# Patient Record
Sex: Female | Born: 1970 | Race: White | Hispanic: No | Marital: Single | Smoking: Current some day smoker
Health system: Southern US, Community
[De-identification: ages and names within clinical notes are randomized; demographics above are authoritative.]

## PROBLEM LIST (undated history)

## (undated) DIAGNOSIS — E785 Hyperlipidemia, unspecified: Secondary | ICD-10-CM

## (undated) DIAGNOSIS — F32A Depression, unspecified: Secondary | ICD-10-CM

## (undated) DIAGNOSIS — F329 Major depressive disorder, single episode, unspecified: Secondary | ICD-10-CM

## (undated) DIAGNOSIS — I10 Essential (primary) hypertension: Secondary | ICD-10-CM

## (undated) HISTORY — DX: Depression, unspecified: F32.A

## (undated) HISTORY — PX: EYE SURGERY: SHX253

## (undated) HISTORY — DX: Essential (primary) hypertension: I10

## (undated) HISTORY — PX: APPENDECTOMY: SHX54

## (undated) HISTORY — DX: Hyperlipidemia, unspecified: E78.5

## (undated) HISTORY — PX: BREAST SURGERY: SHX581

## (undated) HISTORY — DX: Major depressive disorder, single episode, unspecified: F32.9

## (undated) HISTORY — PX: ABDOMINAL HYSTERECTOMY: SHX81

## (undated) HISTORY — PX: NASAL SEPTUM SURGERY: SHX37

## (undated) HISTORY — PX: THYROIDECTOMY, PARTIAL: SHX18

---

## 1999-07-19 ENCOUNTER — Inpatient Hospital Stay (HOSPITAL_COMMUNITY): Admission: AD | Admit: 1999-07-19 | Discharge: 1999-07-31 | Payer: Self-pay | Admitting: Obstetrics & Gynecology

## 1999-07-21 ENCOUNTER — Encounter: Payer: Self-pay | Admitting: Obstetrics and Gynecology

## 1999-07-24 ENCOUNTER — Encounter: Payer: Self-pay | Admitting: Obstetrics and Gynecology

## 1999-07-27 ENCOUNTER — Encounter: Payer: Self-pay | Admitting: Obstetrics & Gynecology

## 1999-07-31 ENCOUNTER — Encounter: Payer: Self-pay | Admitting: Obstetrics and Gynecology

## 1999-08-04 ENCOUNTER — Encounter: Payer: Self-pay | Admitting: Obstetrics and Gynecology

## 1999-08-04 ENCOUNTER — Inpatient Hospital Stay (HOSPITAL_COMMUNITY): Admission: AD | Admit: 1999-08-04 | Discharge: 1999-08-04 | Payer: Self-pay | Admitting: Obstetrics and Gynecology

## 1999-08-13 ENCOUNTER — Inpatient Hospital Stay (HOSPITAL_COMMUNITY): Admission: AD | Admit: 1999-08-13 | Discharge: 1999-09-03 | Payer: Self-pay | Admitting: Obstetrics and Gynecology

## 1999-08-14 ENCOUNTER — Encounter: Payer: Self-pay | Admitting: Obstetrics and Gynecology

## 1999-08-17 ENCOUNTER — Encounter: Payer: Self-pay | Admitting: Obstetrics and Gynecology

## 1999-08-20 ENCOUNTER — Encounter: Payer: Self-pay | Admitting: Obstetrics and Gynecology

## 1999-08-23 ENCOUNTER — Encounter: Payer: Self-pay | Admitting: Obstetrics and Gynecology

## 1999-08-30 ENCOUNTER — Encounter: Payer: Self-pay | Admitting: Obstetrics and Gynecology

## 1999-08-30 ENCOUNTER — Encounter: Payer: Self-pay | Admitting: *Deleted

## 2001-03-12 ENCOUNTER — Other Ambulatory Visit: Admission: RE | Admit: 2001-03-12 | Discharge: 2001-03-12 | Payer: Self-pay | Admitting: Family Medicine

## 2001-11-30 ENCOUNTER — Ambulatory Visit (HOSPITAL_COMMUNITY): Admission: RE | Admit: 2001-11-30 | Discharge: 2001-11-30 | Payer: Self-pay | Admitting: Family Medicine

## 2001-11-30 ENCOUNTER — Encounter: Payer: Self-pay | Admitting: Family Medicine

## 2001-12-17 ENCOUNTER — Other Ambulatory Visit: Admission: RE | Admit: 2001-12-17 | Discharge: 2001-12-17 | Payer: Self-pay | Admitting: Obstetrics and Gynecology

## 2002-05-30 ENCOUNTER — Encounter: Payer: Self-pay | Admitting: *Deleted

## 2002-05-30 ENCOUNTER — Emergency Department (HOSPITAL_COMMUNITY): Admission: EM | Admit: 2002-05-30 | Discharge: 2002-05-30 | Payer: Self-pay | Admitting: *Deleted

## 2003-01-07 ENCOUNTER — Other Ambulatory Visit: Admission: RE | Admit: 2003-01-07 | Discharge: 2003-01-07 | Payer: Self-pay | Admitting: Obstetrics and Gynecology

## 2004-01-27 ENCOUNTER — Other Ambulatory Visit: Admission: RE | Admit: 2004-01-27 | Discharge: 2004-01-27 | Payer: Self-pay | Admitting: Obstetrics and Gynecology

## 2004-01-29 ENCOUNTER — Emergency Department (HOSPITAL_COMMUNITY): Admission: EM | Admit: 2004-01-29 | Discharge: 2004-01-29 | Payer: Self-pay | Admitting: Emergency Medicine

## 2004-07-25 ENCOUNTER — Encounter (INDEPENDENT_AMBULATORY_CARE_PROVIDER_SITE_OTHER): Payer: Self-pay | Admitting: *Deleted

## 2004-07-25 ENCOUNTER — Observation Stay (HOSPITAL_COMMUNITY): Admission: RE | Admit: 2004-07-25 | Discharge: 2004-07-26 | Payer: Self-pay | Admitting: Obstetrics and Gynecology

## 2004-08-11 ENCOUNTER — Encounter: Payer: Self-pay | Admitting: Obstetrics and Gynecology

## 2004-08-11 ENCOUNTER — Inpatient Hospital Stay (HOSPITAL_COMMUNITY): Admission: AD | Admit: 2004-08-11 | Discharge: 2004-08-15 | Payer: Self-pay | Admitting: Obstetrics and Gynecology

## 2004-08-22 ENCOUNTER — Ambulatory Visit (HOSPITAL_COMMUNITY): Admission: RE | Admit: 2004-08-22 | Discharge: 2004-08-22 | Payer: Self-pay | Admitting: Obstetrics and Gynecology

## 2004-08-28 ENCOUNTER — Inpatient Hospital Stay (HOSPITAL_COMMUNITY): Admission: AD | Admit: 2004-08-28 | Discharge: 2004-08-30 | Payer: Self-pay | Admitting: Obstetrics and Gynecology

## 2004-11-21 ENCOUNTER — Ambulatory Visit (HOSPITAL_COMMUNITY): Admission: RE | Admit: 2004-11-21 | Discharge: 2004-11-21 | Payer: Self-pay | Admitting: Obstetrics and Gynecology

## 2006-06-03 ENCOUNTER — Other Ambulatory Visit: Admission: RE | Admit: 2006-06-03 | Discharge: 2006-06-03 | Payer: Self-pay | Admitting: Family Medicine

## 2007-02-03 ENCOUNTER — Encounter: Admission: RE | Admit: 2007-02-03 | Discharge: 2007-02-19 | Payer: Self-pay | Admitting: Family Medicine

## 2008-09-05 ENCOUNTER — Encounter (HOSPITAL_COMMUNITY): Admission: RE | Admit: 2008-09-05 | Discharge: 2008-09-20 | Payer: Self-pay | Admitting: Endocrinology

## 2008-09-30 ENCOUNTER — Ambulatory Visit (HOSPITAL_COMMUNITY): Admission: RE | Admit: 2008-09-30 | Discharge: 2008-09-30 | Payer: Self-pay | Admitting: Endocrinology

## 2008-10-04 ENCOUNTER — Other Ambulatory Visit: Admission: RE | Admit: 2008-10-04 | Discharge: 2008-10-04 | Payer: Self-pay | Admitting: Interventional Radiology

## 2008-10-04 ENCOUNTER — Encounter (INDEPENDENT_AMBULATORY_CARE_PROVIDER_SITE_OTHER): Payer: Self-pay | Admitting: Interventional Radiology

## 2008-10-04 ENCOUNTER — Encounter: Admission: RE | Admit: 2008-10-04 | Discharge: 2008-10-04 | Payer: Self-pay | Admitting: Endocrinology

## 2008-11-17 ENCOUNTER — Ambulatory Visit (HOSPITAL_COMMUNITY): Admission: RE | Admit: 2008-11-17 | Discharge: 2008-11-18 | Payer: Self-pay | Admitting: Otolaryngology

## 2008-11-17 ENCOUNTER — Encounter (INDEPENDENT_AMBULATORY_CARE_PROVIDER_SITE_OTHER): Payer: Self-pay | Admitting: Otolaryngology

## 2009-11-13 ENCOUNTER — Encounter
Admission: RE | Admit: 2009-11-13 | Discharge: 2010-02-11 | Payer: Self-pay | Admitting: Physical Medicine and Rehabilitation

## 2010-10-14 ENCOUNTER — Encounter: Payer: Self-pay | Admitting: Endocrinology

## 2011-01-08 LAB — CBC
HCT: 40 % (ref 36.0–46.0)
Hemoglobin: 14.1 g/dL (ref 12.0–15.0)
MCHC: 35.3 g/dL (ref 30.0–36.0)
MCV: 89 fL (ref 78.0–100.0)
Platelets: 253 10*3/uL (ref 150–400)
RBC: 4.49 MIL/uL (ref 3.87–5.11)
RDW: 12.4 % (ref 11.5–15.5)
WBC: 9.3 10*3/uL (ref 4.0–10.5)

## 2011-01-08 LAB — BASIC METABOLIC PANEL
BUN: 7 mg/dL (ref 6–23)
CO2: 30 mEq/L (ref 19–32)
Calcium: 9.5 mg/dL (ref 8.4–10.5)
Chloride: 104 mEq/L (ref 96–112)
Creatinine, Ser: 0.63 mg/dL (ref 0.4–1.2)
GFR calc Af Amer: >60 mL/min (ref >60)
GFR calc non Af Amer: >60 mL/min (ref >60)
Glucose, Bld: 93 mg/dL (ref 70–99)
Potassium: 4 mEq/L (ref 3.5–5.1)
Sodium: 139 mEq/L (ref 135–145)

## 2011-02-05 NOTE — Op Note (Signed)
NAMENAVPREET, SZCZYGIEL           ACCOUNT NO.:  [id number]   MEDICAL RECORD NO.:  [id number]          PATIENT TYPE:  AMB   LOCATION:  SDS                          FACILITY:  [facility]   PHYSICIAN:  Suzanna Obey, M.D.       DATE OF BIRTH:  [birth year]   DATE OF PROCEDURE:  11/17/2008  DATE OF DISCHARGE:                               OPERATIVE REPORT   PREOPERATIVE DIAGNOSES:  Left thyroid mass.   POSTOPERATIVE DIAGNOSIS:  Left thyroid mass.   SURGICAL PROCEDURE:  Left thyroidectomy.   ANESTHESIA:  General.   ESTIMATED BLOOD LOSS:  Approximately 20 mL.   INDICATIONS:  This is a 40 year old with a mass in the left thyroid that  was diagnosed with ultrasound and then a needle biopsy performed, which  showed follicular cells.  She was informed of the options of approach to  this lesion.  The surgery was discussed.  The risks and benefits were  discussed.  All her questions were answered and consent was obtained.   OPERATION:  The patient was taken to the operating room and placed in  supine position.  After adequate general endotracheal tube anesthesia  with the NIMs monitor endotracheal tube.  Incision was made at the base  of the neck.  Dissection was carried superiorly and inferiorly with  subplatysmal flap.  The self-retaining retractor was placed.  The  midline was identified.  The midline of the strap muscles was dissected  across to the left thyroid lobe where the careful dissection using the  capsule of the thyroid was dissected with hemostat dissection going  laterally, once lateral, the nerve was identified it was followed up in  to the tracheolaryngeal groove and this preserved.  The parathyroid  gland was right superior to this and it was preserved as well.  The  inferior gland was not identified.  The gland was then removed off the  Berry ligament, and the isthmus of the thyroid was divided.  There was  good hemostasis and the wound was irrigated copiously with saline.  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  The wound was then  closed with interrupted 4-0 chromic and a #7 drain was placed secured  with a 3-0 nylon.  The subcutaneous tissue was closed with interrupted 4-  0 chromic and Dermabond to close the skin.  The patient was awakened and  brought to the recovery room in stable condition and counts correct.           ______________________________  Suzanna Obey, M.D.     JB/MEDQ  D:  11/17/2008  T:  11/18/2008  Job:  [id number]   cc:   Dorisann Frames, M.D.

## 2011-02-08 NOTE — H&P (Signed)
NAMESHARMA, Lindsay           ACCOUNT NO.:  [id number]   MEDICAL RECORD NO.:  [id number]          PATIENT TYPE:  AMB   LOCATION:  SDC                           [facility]   PHYSICIAN:  Duke Salvia. Marcelle Overlie, M.D.DATE OF BIRTH:  Dec 21, 1970   DATE OF ADMISSION:  11/21/2004  DATE OF DISCHARGE:                                HISTORY & PHYSICAL   CHIEF COMPLAINT:  Chronic pelvic pain.   HISTORY OF PRESENT ILLNESS:  A 40 year old G2, P2, who underwent  uncomplicated TVH July 25, 2004, for abnormal bleeding.  The ovaries were  normal at the time of surgery.  She was discharged the following day but was  readmitted two weeks later with pelvic abscess that ultimately required CT  drainage.  She had a protracted course that included an additional admission  for IV antibiotics with resolution of the pain and the abscess over time.   Due to continued problems with pelvic pain that has not improved, she  presents now for laparoscopy to evaluate for residual adhesions and the  status of ovarian adhesions in particular.  This procedure, including risks  of bleeding, infection, adjacent organ injury, and the possible need for  open or additional surgery, were all reviewed with her, which she  understands and accepts.  She would prefer to have a diagnostic procedure  only and planned laparotomy with BSO if necessary based on the severity of  the findings at laparoscopy.   PAST MEDICAL HISTORY:  Allergies:  MYCIN drugs.   Current medications are Advil p.r.n. pain.  In the past she has been on  Paxil.   Other surgery:  Breast augmentation, TVH.   Obstetrical history:  Two vaginal deliveries at term without complications.   REVIEW OF SYSTEMS:  Significant for history of depression and anxiety, still  smoking one PPD.  Also a history of borderline hypertension in the past.   FAMILY HISTORY:  Significant for a history of varicose veins and a history  of hypertension in her father.   PHYSICAL EXAMINATION:  VITAL SIGNS:  Temperature 98.2, blood pressure  120/62.  HEENT:  Unremarkable.  NECK:  Supple without mass.  CHEST:  Lungs clear.  CARDIOVASCULAR:  Regular rate and rhythm without murmurs, rubs or gallops  noted.  BREASTS:  Implants, no masses noted.  ABDOMEN:  Soft, flat and nontender.  PELVIC:  Normal external genitalia.  Vaginal cuff is clear.  Bimanual  revealed no masses but slightly tender.  EXTREMITIES:  Unremarkable.  NEUROLOGIC:  Unremarkable.   IMPRESSION:  Chronic pelvic pain, status post total vaginal hysterectomy,  complicated by postoperative pelvic abscess formation as noted above.      RMH/MEDQ  D:  11/19/2004  T:  11/19/2004  Job:  [id number]

## 2011-02-08 NOTE — Discharge Summary (Signed)
Lindsay Dickerson, Lindsay Dickerson           ACCOUNT NO.:  [id number]   MEDICAL RECORD NO.:  [id number]          PATIENT TYPE:  INP   LOCATION:  9320                          [facility]ACILITY:  [facility]   PHYSICIAN:  Duke Salvia. Marcelle Overlie, M.D.DATE OF BIRTH:  30-Dec-1970   DATE OF ADMISSION:  08/10/2004  DATE OF DISCHARGE:                                 DISCHARGE SUMMARY   DISCHARGE DIAGNOSES:  1.  Postoperative pelvic hematoma.  2.  Postoperative pelvic abscess/infection.  3.  CT-directed drainage of pelvic abscess.   SUMMARY OF THE HISTORY AND PHYSICAL EXAMINATION:  Please see admission H&P  for details.  Briefly, a 40 year old who underwent a TVH July 25, 2004.  She developed some diarrhea postoperatively and was treated with Flagyl.  Her C. difficile did return positive at that time. The day of admission her  diarrhea had been absent for some 3 days but she presented with mid and low  abdominal cramping pain with some nausea and vomiting.  Her temperature was  99.1, WBC on admission was elevated, and she had increased fullness on exam.  She was admitted for further evaluation including IV antibiotics.   HOSPITAL COURSE:  After admission, the patient was started on IV  antibiotics.  A CT was done that showed changes consistent with a pelvic  abscess/hematoma.  General surgery was consulted because there was some  concern initially that CT drainage would not be possible, but on further  review it was felt that this would be the best approach.  She was on IV  Unasyn at that point.  On August 11, 2004 she underwent CT-guided abscess  drainage through left gluteal approach that drained a large amount of old,  bloody fluid.  She remained afebrile throughout her hospital course, on a  regular diet.  Her WBC was still elevated in the 16-20,000 range.  By  November 22, hemoglobin 10.3, WBC was 13.9.  The follow-up CT was done at  that point that showed decreased drainage in the central abscess area and  clinically she was much improved at that point.  On the day of discharge -  November 23 - she was tolerating a regular diet, was afebrile, her WBC was  11.6 with a hemoglobin of 10.7.  She will be discharged on antibiotics with  the CT drain in place.  Follow-up CT will be done in 2-3 days and decision  made regarding removing the catheter at that point.   DISPOSITION:  The patient is discharged on:  1.  Augmentin 875 p.o. b.i.d. x7.  2.  Flagyl 500 p.o. q.i.d. x7.  3.  Tylox p.r.n. pain.   She will have follow-up CT in 2-3 days with interventional radiology and  they were notified to contact me regarding findings to decide regarding  removal of the catheter at that point.  I will see her back in my office in  4 days.  Advised to report any fever over 101, increased pain, vaginal  bleeding, persistent nausea or vomiting, or urinary complaints.  She had  already been reviewed specific instructions regarding diet, sex, exercise.   CONDITION:  Good.  ACTIVITY:  Graded increase.     Rich   RMH/MEDQ  D:  08/15/2004  T:  08/15/2004  Job:  [id number]   cc:   Lorne Skeens. Hoxworth, M.D.  [address]  [place]  [place] [address]  Fax: [phone]

## 2011-02-08 NOTE — H&P (Signed)
Lindsay Dickerson, Lindsay Dickerson           ACCOUNT NO.:  [id number]   MEDICAL RECORD NO.:  [id number]          PATIENT TYPE:  INP   LOCATION:  9315                          [facility]ILITY[facility]   PHYSICIAN:  Duke Salvia. Marcelle Overlie, M.D.DATE OF BIRTH:  August 16, 1971   DATE OF ADMISSION:  08/28/2004  DATE OF DISCHARGE:                                HISTORY & PHYSICAL   August 27, 2004   CHIEF COMPLAINT:  Post vaginal hysterectomy, pelvic impaction.   HISTORY OF PRESENT ILLNESS:  A 40 year old, G2, P2 who underwent TVH  November 2 which was uncomplicated except for postoperative anemia.  She  presented back to our office November 14 complaining of some abdominal  cramping and diarrhea which was positive for C difficile at that point and  was started on metronidazole.  When she was then two weeks postop on  November 18 and was evaluated by Dr. Henderson Cloud, her diarrhea had improved,  temperature was 98.2 but she had a diffuse fullness at the cuff with an  ultrasound that showed a 9.5 cm wide pelvic mass consistent with a  hematoma/abscess.  She was admitted to [facility] at that point,  ultimately had CT directed drainage and was sent home on oral Augmentin and  Flagyl with followup CT early December that showed still some drainage per  the cuff with two other areas that were not connected so the drain was  removed at that point.   She was seen yesterday stating that she had felt better over the weekend  after the drain was removed, began to experience some increased pelvic  discomfort. She still had a fullness at the cuff on exam although she was  afebrile in the office yesterday. CBC was obtained showing a white count of  19,000. She call this morning saying she had a temperature elevation of 102  and is admitted now for further evaluation, followup CT scanning, IV  antibiotics and possible redrainage of secondary abscess.   PAST MEDICAL HISTORY:  Allergies:  MYCIN DRUGS.  Operations:  TVH.  The  only  other surgery has been breast augmentation. History is also significant for  depression and anxiety. History of borderline hypertension in the past.   CURRENT MEDICATIONS:  Paxil, Wellbutrin and pain medications.   OBSTETRICAL HISTORY:  Two vaginal deliveries at term without complications.   SOCIAL HISTORY:  She does smoke one PPD.   PHYSICAL EXAMINATION:  VITAL SIGNS:  Temperature 100.6, blood pressure  120/72, pulse 88.  HEENT:  Unremarkable.  NECK:  Supple without masses.  LUNGS:  Clear.  CARDIOVASCULAR:  Regular rate and rhythm without murmurs, rubs or gallops.  BREASTS:  Deferred.  ABDOMEN:  Soft, flat and nontender, no rebound.  Vulva, vaginal cuff normal.  Minimal drainage. The cuff looks like it is healing well.  There is a  fullness on pelvic exam midline slightly posterior.  EXTREMITIES:  Unremarkable.  NEUROLOGIC:  Unremarkable.   IMPRESSION:  Post TVH pelvic hematoma/abscess, possible recurrence.   PLAN:  Will admit for CT this afternoon, possible CT directed drainage  depending on the drainage. Restart IV antibiotics and follow.  Rich   RMH/MEDQ  D:  08/28/2004  T:  08/28/2004  Job:  [id number]

## 2011-02-08 NOTE — Consult Note (Signed)
Lindsay Dickerson, Lindsay Dickerson           ACCOUNT NO.:  [id number]   MEDICAL RECORD NO.:  [id number]          PATIENT TYPE:  OUT   LOCATION:  CATS                         FACILITY:  [facility]   PHYSICIAN:  Sharlet Salina T. Hoxworth, M.D.DATE OF BIRTH:  Jan 26, 1971   DATE OF CONSULTATION:  08/11/2004  DATE OF DISCHARGE:                                   CONSULTATION   CHIEF COMPLAINT:  Abdominal pain following vaginal hysterectomy.   HISTORY OF PRESENT ILLNESS:  I was asked by Dr. Guy Sandifer. Tomblin to evaluate  Lindsay Dickerson today.  She is a 40 year old female who is status post vaginal  hysterectomy on July 25, 2004 for excessive uterine bleeding.  Her  immediate postoperative course was apparently uncomplicated, but at home the  patient complained of persistent abdominal pain.  She describes crampy pain  that initially was somewhat diffuse throughout her abdomen but over the past  week or so has become more localized in the lower abdomen and more intense.  She was seen as an outpatient about 5 days ago and was started on oral  antibiotics at that time.  Her pain, however, intensified over the last  several days and she began having intermittent nausea and vomiting  associated with the pain.  An ultrasound done in her gynecologist's office  revealed a fluid collection in the pelvis and she was admitted for further  evaluation and CT scan obtained.  She has had initially constipation  following the procedure then after taking a laxative had 5 or 6 days of  diarrhea last week, then was given Imodium.  She had no bowel movements for  2 days but has had a normal bowel movement yesterday and today.  She has had  episodes of nausea and vomiting, but this has been related mostly to the  pain, and she is currently not nauseated.  No abdominal distention.  She has  not noted any fever or chills at home.   PAST MEDICAL/SURGICAL HISTORY:  1.  Status post appendectomy.  2.  Breast augmentation.  3.  Sinus  surgery.  4.  Medically she has been treated for depression.  5.  Mild hypertension.   MEDICATIONS:  Paxil and Prinivil 10 daily.   ALLERGIES:  She is allergic to all MYCINS.   REVIEW OF SYSTEMS:  GENERAL: No fever, chills, weight change.  RESPIRATORY:  No shortness of breath, cough, wheezing.  CARDIAC: No chest pain,  palpitations.  ABDOMEN and GI: As above.  Urinary: No burning or frequency.   PHYSICAL EXAMINATION:  VITAL SIGNS: Temperature is 99.9, pulse 116 and  regular, respirations 16, blood pressure 114/75.  GENERAL: Alert, white female who appears uncomfortable.  SKIN: Warm, dry.  No rash or infection.  HEENT: No masses or thyromegaly.  Sclerae are nonicteric.  LUNGS: Clear to auscultation without increased work of breathing.  CARDIAC: Regular rate and rhythm without murmur.  ABDOMEN: Nondistended.  Normoactive bowel sounds.  There is a moderate  diffuse lower abdominal tenderness with some guarding.  No palpable masses.  There is a percutaneous drain present in the left gluteal area draining what  appears to be  old bloody fluid.  EXTREMITIES: No joint swelling or deformity.  NEUROLOGIC: Alert and oriented.  Motor intensity grossly normal.   LABORATORY:  White count today 16.2 down from 18,000 on admission,  hemoglobin 11.4.  Electrolytes abnormal for sodium of 131, albumin is 2.5.   I reviewed her CT of the abdomen and pelvis.  This shows a large fluid  collection in the central pelvis consistent with abscess.  There are several  adjacent smaller fluid collections some of which appear to communicate with  the larger collection.   ASSESSMENT AND PLAN:  Postoperative pelvic hematoma, possible infected early  abscess.  I recommended percutaneous drainage which has already been  accomplished by the time I am seeing the patient and it appears to have been  successful.  The patient is already on broad-spectrum antibiotics on Unasyn  appropriately.  We will await cultures from  the fluid and follow closely  clinically.  We will plan to repeat a CT scan in 3 to 5 days depending on  the patient's clinical course.     Benj   BTH/MEDQ  D:  08/11/2004  T:  08/11/2004  Job:  [id number]

## 2011-02-08 NOTE — H&P (Signed)
Lindsay Dickerson, SHIFLET           ACCOUNT NO.:  [id number]   MEDICAL RECORD NO.:  [id number]          PATIENT TYPE:  INP   LOCATION:  NA                            [facility]   PHYSICIAN:  Duke Salvia. Marcelle Overlie, M.D.DATE OF BIRTH:  Jun 01, 1971   DATE OF ADMISSION:  DATE OF DISCHARGE:                                HISTORY & PHYSICAL   CHIEF COMPLAINT:  Abnormal uterine bleeding unresponsive to conservative  measures.   HISTORY OF PRESENT ILLNESS:  A 40 year old G2, P2 with persistent problems  with menorrhagia that have not improved with hormonal management.  SHG April  of 2004 was normal.  She presents at this time for definitive TVH.  This  procedure including the risk of bleeding, infection, adjacent organ injury,  the possible need for open or additional surgery discussed along with her  expected recovery time.   PAST MEDICAL HISTORY:   ALLERGIES:  Mycin drugs.   CURRENT MEDICATIONS:  Paxil and Wellbutrin.  OCP's, (Microgestin).   SURGERY:  Breast augmentation.   OBSTETRICAL HISTORY:  She has had two vaginal deliveries without  complication.   REVIEW OF SYSTEMS:  Significant for a history of depression and anxiety,  history of borderline hypertension in the past, still is smoking.  One PPD.   PHYSICAL EXAMINATION:  VITAL SIGNS:  Temperature 98.2, blood pressure  120/75.  HEENT:  Unremarkable.  NECK:  Supple.  No masses.  LUNGS:  Clear.  CARDIOVASCULAR:  A regular rate and rhythm without murmurs, rubs or gallops.  BREASTS:  She has breast implants.  No masses noted.  ABDOMEN:  Soft, flat, nontender.  PELVIC:  Normal external genitalia.  Vagina stripe is clear.  Uterus is mid  position, mobile.  Adnexa negative.   IMPRESSION:  Persistent menorrhagia, uncontrolled with conservative  measures.   PLAN:  TBH.  The procedure and the risks were reviewed as above.     Rich   RMH/MEDQ  D:  07/24/2004  T:  07/24/2004  Job:  [id number]

## 2011-02-08 NOTE — Op Note (Signed)
Lindsay Dickerson, Lindsay Dickerson           ACCOUNT NO.:  [id number]   MEDICAL RECORD NO.:  [id number]           PATIENT TYPE:   LOCATION:                                 FACILITY:   PHYSICIAN:  Duke Salvia. Marcelle Overlie, M.D.    DATE OF BIRTH:   DATE OF PROCEDURE:  07/25/2004  DATE OF DISCHARGE:                                 OPERATIVE REPORT   PREOPERATIVE DIAGNOSIS:  Menorrhagia.   POSTOPERATIVE DIAGNOSIS:  Menorrhagia.   PROCEDURE:  TVH.   SURGEON:  Duke Salvia. Marcelle Overlie, M.D.   ANESTHESIA:  General endotracheal.   COMPLICATIONS:  None.   DRAINS:  Foley catheter.   BLOOD LOSS:  Less than 100 mL.   PROCEDURE AND FINDINGS:  The patient is taken to the operating room.  After  an adequate level of general endotracheal anesthesia was obtained with the  patient's legs in stirrups, perineum and vagina were prepped and draped with  Betadine.  The bladder was drained.  __EUA________ carried out.  The uterus  was upper limit of normal of normal size, mobile, posterior.  Adnexa are  negative.  A weighted speculum was positioned.  The cervix was grasped with  tenaculum and cervicovaginal mucosa was incised.  Posterior culdotomy  performed without difficulty.  The left uterosacral ligament was then  clamped, divided, and suture ligated with 0 Dexon in Haney fashion and held  temporarily.  The exact same repeated on the opposite side.  The anterior  mucosa was advanced superiorly until the peritoneal reflection could be  identified.  This was entered sharply and then retractor was then used to  gently elevated the bladder out of the field.  The LigaSure system was then  used to clamp, coagulate, and cut the uterine vasculature pedicles and upper  broad ligament pedicles.  After this was completed and was noted to be  hemostatic, the fundus of the uterus was then delivered posteriorly.  The  remaining utero-ovarian pedicle was clamped with a curved Haney clamp and  the specimen excised.  These  pedicles were first free tied with 0 Dexon  suture followed by a suture ligature of Dexon and held temporarily.  The  cuff was then closed from 3 to 9 o'clock with a 2-0 running locked suture.  All major pedicles were inspected and noted to be normal at that time.  Tubes and ovaries were normal and were thus conserved.  Prior to closure,  sponge, needle, instrument counts reported as correct x2.  McCall's  culdoplasty suture was then placed from left uterosacral ligament taking up  posterior  peritoneum across to the right uterosacral ligament and tied down for extra  posterior support.  The cuff was closed from right to left with interrupted  2-0 Monocryl sutures with excellent hemostasis.  A Foley catheter was  positioned draining clear urine.  She tolerated this well, went to recovery  room in good condition.     Rich   RMH/MEDQ  D:  07/25/2004  T:  07/25/2004  Job:  [id number]

## 2011-02-08 NOTE — Op Note (Signed)
Lindsay Dickerson, Lindsay Dickerson           ACCOUNT NO.:  [id number]   MEDICAL RECORD NO.:  [id number]          PATIENT TYPE:  AMB   LOCATION:  SDC                           [facility]   PHYSICIAN:  Duke Salvia. Marcelle Overlie, M.D.DATE OF BIRTH:  1970-10-07   DATE OF PROCEDURE:  11/21/2004  DATE OF DISCHARGE:                                 OPERATIVE REPORT   PREOPERATIVE DIAGNOSES:  Chronic pelvic pain.   POSTOPERATIVE DIAGNOSES:  Chronic pelvic pain.   PROCEDURE:  Diagnostic laparoscopy with lysis of extensive adhesions, right  ovarian cystotomy.   SURGEON:  Duke Salvia. Marcelle Overlie, M.D.   ANESTHESIA:  General endotracheal.   COMPLICATIONS:  None.   DRAINS:  Foley catheter.   ESTIMATED BLOOD LOSS:  Less than 5 mL.   DESCRIPTION OF PROCEDURE:  The patient was taken to the operating room after  an adequate level of general endotracheal anesthesia was obtained. With the  patient's legs in stirrups, the abdomen, perineum and vagina were prepped  and draped with Betadine, the bladder was drained, EUA carried out and no  masses were noted. This patient has had a previous TVH 3-4 months ago.  Attention direct to the subumbilical area where 0.5% plain was infiltrated,  a small incision was made, the Veress needle was introduced without  difficulty, its intraabdominal position was verified by pressure and water  testing. After 2.5 liter pneumoperitoneum was then created, laparoscopic  trocar and sleeve were then introduced without difficulty. Three  fingerbreadths above the symphysis in the midline, a 5 mm trocar was  inserted under direct visualization.  The patient then placed in  Trendelenburg and the pelvic findings as followings.   There were some filmy adhesions of the sigmoid colon into the area of the  vaginal cuff. These were lysed in a vascular plane allowing the colon to be  gently pushed out of the way with the patient in Trendelenburg. Once this  was accomplished, better  visualization was obtained. There was some minimal  omental adhesions into the left anterior abdominal wall which were lysed in  a vascular plane.  Numerous filmy adhesions around the vaginal cuff area and  surrounding both ovaries were then lysed easily. Once this was accomplished,  the ovaries could be elevated and the left ovary was noted to be small and  50% adherent to the left pelvic sidewall but appeared to be normal. The  right ovary was able to be manipulated easily but was enlarged with a 4 cm  smooth wall cyst, bipolar was used to coagulate the surface and cystotomy  was performed, clear fluid was then drained. The [place]zhat was used to irrigate  the inside of the cyst and a small old clot was extruded, this was removed.  This area was hemostatic and the cyst was easily deflated.  The upper  abdomen was unremarkable. No other abnormalities were noted. The pelvis was  then  irrigated copiously with LR and aspirated. No other abnormalities or  adhesions were noted. Instruments were removed, gas allowed to escape,  defects closed with 4-0 Dexon subcuticular sutures and Dermabond. She  tolerated this well and  went to recovery room in good condition.      RMH/MEDQ  D:  11/21/2004  T:  11/21/2004  Job:  [id number]

## 2012-12-21 ENCOUNTER — Telehealth: Payer: Self-pay | Admitting: Nurse Practitioner

## 2012-12-21 NOTE — Telephone Encounter (Signed)
Called [facility] and left message to please send electronic request for cholesterol med refill.

## 2013-05-17 ENCOUNTER — Encounter: Payer: Self-pay | Admitting: Nurse Practitioner

## 2013-05-19 ENCOUNTER — Other Ambulatory Visit: Payer: Self-pay | Admitting: Nurse Practitioner

## 2013-05-20 NOTE — Telephone Encounter (Signed)
Last seen 10/29/12  MMM  Last lipid 10/29/12

## 2013-06-20 ENCOUNTER — Other Ambulatory Visit: Payer: Self-pay | Admitting: Nurse Practitioner

## 2013-07-26 ENCOUNTER — Other Ambulatory Visit: Payer: Self-pay | Admitting: Nurse Practitioner

## 2013-07-28 ENCOUNTER — Other Ambulatory Visit: Payer: Self-pay | Admitting: Nurse Practitioner

## 2013-07-28 NOTE — Telephone Encounter (Signed)
ntbs

## 2013-07-28 NOTE — Telephone Encounter (Signed)
Last seen and last lipids 10/29/12  MMM

## 2013-08-24 ENCOUNTER — Other Ambulatory Visit: Payer: Self-pay | Admitting: Nurse Practitioner

## 2013-09-24 ENCOUNTER — Other Ambulatory Visit: Payer: Self-pay | Admitting: Nurse Practitioner

## 2013-09-28 ENCOUNTER — Telehealth: Payer: Self-pay | Admitting: Nurse Practitioner

## 2013-09-28 MED ORDER — LISINOPRIL-HYDROCHLOROTHIAZIDE 20-12.5 MG PO TABS: 1.0000 | ORAL_TABLET | Freq: Every day | ORAL | Status: DC

## 2013-09-28 MED ORDER — ATORVASTATIN CALCIUM 40 MG PO TABS: 40.0000 mg | ORAL_TABLET | Freq: Every day | ORAL | Status: DC

## 2013-09-28 NOTE — Telephone Encounter (Signed)
patiet aware to pick up rx at pharmacy

## 2013-09-30 ENCOUNTER — Other Ambulatory Visit: Payer: Self-pay | Admitting: Nurse Practitioner

## 2013-09-30 DIAGNOSIS — E785 Hyperlipidemia, unspecified: Secondary | ICD-10-CM

## 2013-09-30 DIAGNOSIS — I10 Essential (primary) hypertension: Secondary | ICD-10-CM

## 2013-10-01 ENCOUNTER — Ambulatory Visit: Payer: Self-pay | Admitting: Nurse Practitioner

## 2013-10-27 ENCOUNTER — Ambulatory Visit (INDEPENDENT_AMBULATORY_CARE_PROVIDER_SITE_OTHER): Payer: Medicaid Other | Admitting: Nurse Practitioner

## 2013-10-27 ENCOUNTER — Encounter: Payer: Self-pay | Admitting: Nurse Practitioner

## 2013-10-27 VITALS — BP 115/81 | HR 89 | Temp 98.4°F | Ht 64.0 in | Wt 130.0 lb

## 2013-10-27 DIAGNOSIS — F329 Major depressive disorder, single episode, unspecified: Secondary | ICD-10-CM

## 2013-10-27 DIAGNOSIS — M25511 Pain in right shoulder: Secondary | ICD-10-CM

## 2013-10-27 DIAGNOSIS — E785 Hyperlipidemia, unspecified: Secondary | ICD-10-CM | POA: Insufficient documentation

## 2013-10-27 DIAGNOSIS — F3289 Other specified depressive episodes: Secondary | ICD-10-CM

## 2013-10-27 DIAGNOSIS — I10 Essential (primary) hypertension: Secondary | ICD-10-CM

## 2013-10-27 DIAGNOSIS — M25519 Pain in unspecified shoulder: Secondary | ICD-10-CM

## 2013-10-27 DIAGNOSIS — F32A Depression, unspecified: Secondary | ICD-10-CM

## 2013-10-27 MED ORDER — ATORVASTATIN CALCIUM 40 MG PO TABS: ORAL_TABLET | ORAL | Status: DC

## 2013-10-27 MED ORDER — ESCITALOPRAM OXALATE 20 MG PO TABS: 20.0000 mg | ORAL_TABLET | Freq: Every day | ORAL | Status: DC

## 2013-10-27 MED ORDER — LISINOPRIL-HYDROCHLOROTHIAZIDE 20-12.5 MG PO TABS: 1.0000 | ORAL_TABLET | Freq: Every day | ORAL | Status: DC

## 2013-10-27 NOTE — Progress Notes (Signed)
Subjective:    Patient ID: Lindsay Dickerson, female    DOB: Nov 03, 1970, 43 y.o.   MRN: [id number]  Hypertension This is a chronic problem. The current episode started more than 1 year ago. The problem has been resolved since onset. The problem is controlled. Pertinent negatives include no anxiety, chest pain, headaches, neck pain, palpitations, peripheral edema, shortness of breath or sweats. There are no associated agents to hypertension. Risk factors for coronary artery disease include dyslipidemia, family history and post-menopausal state. Past treatments include Lindsay inhibitors and diuretics. The current treatment provides significant improvement. Compliance problems include diet and exercise.   Hyperlipidemia This is a chronic problem. The current episode started more than 1 year ago. The problem is controlled. Recent lipid tests were reviewed and are normal. She has no history of diabetes, hypothyroidism or obesity. Factors aggravating her hyperlipidemia include thiazides. Pertinent negatives include no chest pain or shortness of breath. Current antihyperlipidemic treatment includes statins. The current treatment provides significant improvement of lipids. Compliance problems include adherence to diet and adherence to exercise.   * Patient says for the 2 months she has been unable to completely lift her right arm without pain- denies any injury that she can remember. Has been getting  Worse- now it even hurts to lay on- Had frozen shoulder on left in the past. 8 Family says that she needs antidepressant- says that she is sad all the time and impossible to live with.   Review of Systems  Respiratory: Negative for shortness of breath.   Cardiovascular: Negative for chest pain and palpitations.  Musculoskeletal: Negative for neck pain.  Neurological: Negative for headaches.       Objective:   Physical Exam  Constitutional: She is oriented to person, place, and time. She appears  well-developed and well-nourished.  HENT:  Nose: Nose normal.  Mouth/Throat: Oropharynx is clear and moist.  Eyes: EOM are normal.  Neck: Trachea normal, normal range of motion and full passive range of motion without pain. Neck supple. No JVD present. Carotid bruit is not present. No thyromegaly present.  Cardiovascular: Normal rate, regular rhythm, normal heart sounds and intact distal pulses.  Exam reveals no gallop and no friction rub.   No murmur heard. Pulmonary/Chest: Effort normal and breath sounds normal.  Abdominal: Soft. Bowel sounds are normal. She exhibits no distension and no mass. There is no tenderness.  Musculoskeletal: Normal range of motion.  Decrease ROM of right shoulder with pain on abduction and internal rotation- point tenderness along anterior aspect of shoulder- weak against resisatnce  Lymphadenopathy:    She has no cervical adenopathy.  Neurological: She is alert and oriented to person, place, and time. She has normal reflexes.  Skin: Skin is warm and dry.  Psychiatric: She has a normal mood and affect. Her behavior is normal. Judgment and thought content normal.   BP 115/81  Pulse 89  Temp(Src) 98.4 F (36.9 C) (Oral)  Ht 5' 4"  (1.626 m)  Wt 130 lb (58.968 kg)  BMI 22.30 kg/m2        Assessment & Plan:   1. Hypertension   2. Hyperlipidemia LDL goal < 100   3. Depression   4. Right shoulder pain    Orders Placed This Encounter  Procedures  . CMP14+EGFR  . NMR, lipoprofile  . Ambulatory referral to Orthopedic Surgery    Referral Priority:  Routine    Referral Type:  Surgical    Referral Reason:  Specialty Services Required  Requested Specialty:  Orthopedic Surgery    Number of Visits Requested:  1   Meds ordered this encounter  Medications  . escitalopram (LEXAPRO) 20 MG tablet    Sig: Take 1 tablet (20 mg total) by mouth daily.    Dispense:  30 tablet    Refill:  6    Order Specific Question:  Supervising Provider    Answer:  Chipper Herb [[id number]]  . lisinopril-hydrochlorothiazide (PRINZIDE,ZESTORETIC) 20-12.5 MG per tablet    Sig: Take 1 tablet by mouth daily.    Dispense:  30 tablet    Refill:  0    Needs to be seen before next refill    Order Specific Question:  Supervising Provider    Answer:  Chipper Herb [[id number]]  . atorvastatin (LIPITOR) 40 MG tablet    Sig: TAKE 1 TABLET BY MOUTH EVERY DAY    Dispense:  30 tablet    Refill:  0    Needs to be seen before next refill    Order Specific Question:  Supervising Provider    Answer:  Chipper Herb [[id number]]    Labs pending Health maintenance reviewed Diet and exercise encouraged Continue all meds Follow up  In 3 months   [place], FNP

## 2013-10-27 NOTE — Patient Instructions (Signed)

## 2013-10-29 LAB — NMR, LIPOPROFILE
Cholesterol: 108 mg/dL (ref <200)
HDL Cholesterol by NMR: 28 mg/dL — ABNORMAL LOW (ref >=40)
HDL Particle Number: 25.2 umol/L — ABNORMAL LOW (ref >=30.5)
LDL Particle Number: 842 nmol/L (ref <1000)
LDL Size: 19.6 nm — ABNORMAL LOW (ref >20.5)
LDLC SERPL CALC-MCNC: 48 mg/dL (ref <100)
LP-IR Score: 63 — ABNORMAL HIGH (ref <=45)
Small LDL Particle Number: 660 nmol/L — ABNORMAL HIGH (ref <=527)
Triglycerides by NMR: 162 mg/dL — ABNORMAL HIGH (ref <150)

## 2013-10-29 LAB — CMP14+EGFR
ALT: 14 IU/L (ref 0–32)
AST: 19 IU/L (ref 0–40)
Albumin/Globulin Ratio: 1.8 (ref 1.1–2.5)
Albumin: 5.1 g/dL (ref 3.5–5.5)
Alkaline Phosphatase: 82 IU/L (ref 39–117)
BUN/Creatinine Ratio: 16 (ref 9–23)
BUN: 10 mg/dL (ref 6–24)
CO2: 21 mmol/L (ref 18–29)
Calcium: 10.2 mg/dL (ref 8.7–10.2)
Chloride: 96 mmol/L — ABNORMAL LOW (ref 97–108)
Creatinine, Ser: 0.63 mg/dL (ref 0.57–1.00)
GFR calc Af Amer: 126 mL/min/{1.73_m2} (ref >59)
GFR calc non Af Amer: 109 mL/min/{1.73_m2} (ref >59)
Globulin, Total: 2.9 g/dL (ref 1.5–4.5)
Glucose: 115 mg/dL — ABNORMAL HIGH (ref 65–99)
Potassium: 4.2 mmol/L (ref 3.5–5.2)
Sodium: 138 mmol/L (ref 134–144)
Total Bilirubin: 0.7 mg/dL (ref 0.0–1.2)
Total Protein: 8 g/dL (ref 6.0–8.5)

## 2013-12-01 ENCOUNTER — Other Ambulatory Visit: Payer: Self-pay | Admitting: Nurse Practitioner

## 2014-03-02 ENCOUNTER — Telehealth: Payer: Self-pay | Admitting: Nurse Practitioner

## 2014-03-03 NOTE — Telephone Encounter (Signed)
Patient wants to know if you can refill her mertronizole cream

## 2014-03-04 MED ORDER — METRONIDAZOLE 0.75 % VA GEL: 1.0000 | Freq: Two times a day (BID) | VAGINAL | Status: DC

## 2014-03-04 NOTE — Telephone Encounter (Signed)
rx sent to pharmacy

## 2014-03-06 ENCOUNTER — Other Ambulatory Visit: Payer: Self-pay | Admitting: Nurse Practitioner

## 2014-03-07 ENCOUNTER — Telehealth: Payer: Self-pay | Admitting: Nurse Practitioner

## 2014-03-07 NOTE — Telephone Encounter (Signed)
Called pt regarding appt Pt was not aware of RX sent in to pharmacy until today Wants to try RX first but if sxs persist will call to schedule

## 2014-04-15 ENCOUNTER — Ambulatory Visit (INDEPENDENT_AMBULATORY_CARE_PROVIDER_SITE_OTHER): Payer: Medicaid Other | Admitting: Family Medicine

## 2014-04-15 ENCOUNTER — Telehealth: Payer: Self-pay | Admitting: Nurse Practitioner

## 2014-04-15 VITALS — BP 117/72 | HR 82 | Temp 99.2°F | Ht 64.0 in | Wt 127.8 lb

## 2014-04-15 DIAGNOSIS — J0101 Acute recurrent maxillary sinusitis: Secondary | ICD-10-CM

## 2014-04-15 DIAGNOSIS — B9689 Other specified bacterial agents as the cause of diseases classified elsewhere: Secondary | ICD-10-CM

## 2014-04-15 DIAGNOSIS — J0100 Acute maxillary sinusitis, unspecified: Secondary | ICD-10-CM

## 2014-04-15 DIAGNOSIS — A499 Bacterial infection, unspecified: Secondary | ICD-10-CM

## 2014-04-15 DIAGNOSIS — N760 Acute vaginitis: Secondary | ICD-10-CM

## 2014-04-15 MED ORDER — METRONIDAZOLE 0.75 % VA GEL: 1.0000 | Freq: Two times a day (BID) | VAGINAL | Status: DC

## 2014-04-15 MED ORDER — METHYLPREDNISOLONE ACETATE 80 MG/ML IJ SUSP
80.0000 mg | Freq: Once | INTRAMUSCULAR | Status: AC
  Administered 2014-04-15: 80 mg via INTRAMUSCULAR

## 2014-04-15 MED ORDER — FLUCONAZOLE 150 MG PO TABS: 150.0000 mg | ORAL_TABLET | Freq: Once | ORAL | Status: DC

## 2014-04-15 MED ORDER — AMOXICILLIN 875 MG PO TABS: 875.0000 mg | ORAL_TABLET | Freq: Two times a day (BID) | ORAL | Status: DC

## 2014-04-15 NOTE — Progress Notes (Signed)
   Subjective:    Patient ID: Lindsay Dickerson, female    DOB: 02/05/1971, 43 y.o.   MRN: [id number]  HPI C/o sinus infection and she states she has BV and needs refill on her flagyl.   Review of Systems No chest pain, SOB, HA, dizziness, vision change, N/V, diarrhea, constipation, dysuria, urinary urgency or frequency, myalgias, arthralgias or rash.     Objective:   Physical Exam   Vital signs noted  Well developed well nourished female.  HEENT - Head atraumatic Normocephalic                Eyes - PERRLA, Conjuctiva - clear Sclera- Clear EOMI                Ears - EAC's Wnl TM's Wnl Gross Hearing WNL                 Throat - oropharanx wnl Respiratory - Lungs CTA bilateral Cardiac - RRR S1 and S2 without murmur GI - Abdomen soft Nontender and bowel sounds active x 4 Extremities - No edema. Neuro - Grossly intact.     Assessment & Plan:  BV (bacterial vaginosis) - Plan: amoxicillin (AMOXIL) 875 MG tablet, methylPREDNISolone acetate (DEPO-MEDROL) injection 80 mg, fluconazole (DIFLUCAN) 150 MG tablet, metroNIDAZOLE (METROGEL) 0.75 % vaginal gel  Acute recurrent maxillary sinusitis - Plan: amoxicillin (AMOXIL) 875 MG tablet, methylPREDNISolone acetate (DEPO-MEDROL) injection 80 mg, fluconazole (DIFLUCAN) 150 MG tablet, metroNIDAZOLE (METROGEL) 0.75 % vaginal gel  Deatra CanterWilliam J Auna Mikkelsen FNP

## 2014-04-15 NOTE — Telephone Encounter (Signed)
appt given and patient aware that if you agree to call anything in we will call and let her know

## 2014-04-18 NOTE — Telephone Encounter (Signed)
This was taken care of Friday by [place]

## 2014-04-18 NOTE — Telephone Encounter (Signed)
Will see at appointment

## 2014-05-19 ENCOUNTER — Other Ambulatory Visit: Payer: Self-pay | Admitting: Nurse Practitioner

## 2014-05-20 NOTE — Telephone Encounter (Signed)
Last seen 04/15/14  B [place]  Last lipid 11/06/13

## 2014-07-02 ENCOUNTER — Other Ambulatory Visit: Payer: Self-pay | Admitting: Nurse Practitioner

## 2014-07-02 ENCOUNTER — Other Ambulatory Visit: Payer: Self-pay | Admitting: Family Medicine

## 2014-07-05 MED ORDER — ATORVASTATIN CALCIUM 40 MG PO TABS: ORAL_TABLET | ORAL | Status: DC

## 2014-07-18 ENCOUNTER — Telehealth: Payer: Self-pay | Admitting: Nurse Practitioner

## 2014-07-18 NOTE — Telephone Encounter (Signed)
NTBS.

## 2014-07-26 ENCOUNTER — Other Ambulatory Visit: Payer: Medicaid Other | Admitting: Nurse Practitioner

## 2014-08-13 ENCOUNTER — Other Ambulatory Visit: Payer: Self-pay | Admitting: Nurse Practitioner

## 2014-08-16 ENCOUNTER — Other Ambulatory Visit: Payer: Self-pay | Admitting: Nurse Practitioner

## 2014-09-13 ENCOUNTER — Other Ambulatory Visit: Payer: Self-pay | Admitting: Nurse Practitioner

## 2014-09-17 ENCOUNTER — Other Ambulatory Visit: Payer: Self-pay | Admitting: Nurse Practitioner

## 2014-10-05 ENCOUNTER — Encounter: Payer: Self-pay | Admitting: Nurse Practitioner

## 2014-10-05 ENCOUNTER — Ambulatory Visit (INDEPENDENT_AMBULATORY_CARE_PROVIDER_SITE_OTHER): Payer: Medicaid Other | Admitting: Nurse Practitioner

## 2014-10-05 VITALS — BP 101/64 | HR 61 | Temp 97.1°F | Ht 64.0 in | Wt 134.0 lb

## 2014-10-05 DIAGNOSIS — Z23 Encounter for immunization: Secondary | ICD-10-CM

## 2014-10-05 DIAGNOSIS — E785 Hyperlipidemia, unspecified: Secondary | ICD-10-CM

## 2014-10-05 DIAGNOSIS — Z01419 Encounter for gynecological examination (general) (routine) without abnormal findings: Secondary | ICD-10-CM

## 2014-10-05 DIAGNOSIS — I10 Essential (primary) hypertension: Secondary | ICD-10-CM

## 2014-10-05 DIAGNOSIS — Z0000 Encounter for general adult medical examination without abnormal findings: Secondary | ICD-10-CM

## 2014-10-05 DIAGNOSIS — Z1231 Encounter for screening mammogram for malignant neoplasm of breast: Secondary | ICD-10-CM

## 2014-10-05 DIAGNOSIS — F32A Depression, unspecified: Secondary | ICD-10-CM

## 2014-10-05 DIAGNOSIS — F329 Major depressive disorder, single episode, unspecified: Secondary | ICD-10-CM

## 2014-10-05 LAB — POCT CBC
Granulocyte percent: 62.7 %G (ref 37–80)
HEMATOCRIT: 43.9 % (ref 37.7–47.9)
Hemoglobin: 14 g/dL (ref 12.2–16.2)
Lymph, poc: 3.1 (ref 0.6–3.4)
MCH, POC: 28.4 pg (ref 27–31.2)
MCHC: 31.8 g/dL (ref 31.8–35.4)
MCV: 89.4 fL (ref 80–97)
MPV: 8.6 fL (ref 0–99.8)
POC Granulocyte: 5.9 (ref 2–6.9)
POC LYMPH PERCENT: 33.3 %L (ref 10–50)
Platelet Count, POC: 258 10*3/uL (ref 142–424)
RBC: 4.9 M/uL (ref 4.04–5.48)
RDW, POC: 12.7 %
WBC: 9.4 10*3/uL (ref 4.6–10.2)

## 2014-10-05 LAB — POCT URINALYSIS DIPSTICK
BILIRUBIN UA: NEGATIVE
Glucose, UA: NEGATIVE
Ketones, UA: NEGATIVE
LEUKOCYTES UA: NEGATIVE
Nitrite, UA: NEGATIVE
PROTEIN UA: NEGATIVE
Spec Grav, UA: 1.02
Urobilinogen, UA: NEGATIVE
pH, UA: 6.5

## 2014-10-05 LAB — POCT UA - MICROSCOPIC ONLY
Casts, Ur, LPF, POC: NEGATIVE
Crystals, Ur, HPF, POC: NEGATIVE
MUCUS UA: NEGATIVE
RBC, URINE, MICROSCOPIC: NEGATIVE
WBC, UR, HPF, POC: NEGATIVE
Yeast, UA: NEGATIVE

## 2014-10-05 MED ORDER — ATORVASTATIN CALCIUM 40 MG PO TABS: 40.0000 mg | ORAL_TABLET | Freq: Every day | ORAL | Status: DC

## 2014-10-05 MED ORDER — LISINOPRIL-HYDROCHLOROTHIAZIDE 20-12.5 MG PO TABS: 1.0000 | ORAL_TABLET | Freq: Every day | ORAL | Status: DC

## 2014-10-05 MED ORDER — FENOFIBRATE 145 MG PO TABS: 145.0000 mg | ORAL_TABLET | Freq: Every day | ORAL | Status: DC

## 2014-10-05 MED ORDER — ESCITALOPRAM OXALATE 20 MG PO TABS: ORAL_TABLET | ORAL | Status: DC

## 2014-10-05 NOTE — Patient Instructions (Signed)

## 2014-10-05 NOTE — Progress Notes (Signed)
Subjective:    Patient ID: Lindsay Dickerson, female    DOB: [birth year], 44 y.o.   MRN: [id number]   Patient in today for annual physical exam and PAP. She is doing well without complaints.  Hypertension This is a chronic problem. The current episode started more than 1 year ago. The problem is unchanged. The problem is controlled. Pertinent negatives include no chest pain, headaches, neck pain, palpitations or shortness of breath. Risk factors for coronary artery disease include dyslipidemia. Past treatments include Lindsay inhibitors and diuretics. The current treatment provides significant improvement. Compliance problems include diet.   Hyperlipidemia This is a chronic problem. The current episode started more than 1 year ago. The problem is uncontrolled. Recent lipid tests were reviewed and are variable. Pertinent negatives include no chest pain or shortness of breath. Current antihyperlipidemic treatment includes statins. The current treatment provides moderate improvement of lipids. There are no compliance problems.  Risk factors for coronary artery disease include dyslipidemia and hypertension.  Depression Has been on lexapro for several years- is doing well without complaints  Review of Systems  Respiratory: Negative for shortness of breath.   Cardiovascular: Negative for chest pain and palpitations.  Musculoskeletal: Negative for neck pain.  Neurological: Negative for headaches.       Objective:   Physical Exam  Constitutional: She is oriented to person, place, and time. She appears well-developed and well-nourished.  HENT:  Head: Normocephalic.  Right Ear: Hearing, tympanic membrane, external ear and ear canal normal.  Left Ear: Hearing, tympanic membrane, external ear and ear canal normal.  Nose: Nose normal.  Mouth/Throat: Uvula is midline and oropharynx is clear and moist.  Eyes: Conjunctivae and EOM are normal. Pupils are equal, round, and reactive to light.  Neck:  Trachea normal, normal range of motion and full passive range of motion without pain. Neck supple. No JVD present. Carotid bruit is not present. No thyroid mass and no thyromegaly present.  Cardiovascular: Normal rate, regular rhythm, normal heart sounds and intact distal pulses.  Exam reveals no gallop and no friction rub.   No murmur heard. Pulmonary/Chest: Effort normal and breath sounds normal. Right breast exhibits no inverted nipple, no mass, no nipple discharge, no skin change and no tenderness. Left breast exhibits no inverted nipple, no mass, no nipple discharge, no skin change and no tenderness.  Abdominal: Soft. Bowel sounds are normal. She exhibits no distension and no mass. There is no tenderness.  Genitourinary: Vagina normal and uterus normal. No breast swelling, tenderness, discharge or bleeding.  bimanual exam-No adnexal masses or tenderness. Vaginal cuff intact- thick white discharge   Musculoskeletal: Normal range of motion.  Lymphadenopathy:    She has no cervical adenopathy.  Neurological: She is alert and oriented to person, place, and time. She has normal reflexes.  Skin: Skin is warm and dry.  Psychiatric: She has a normal mood and affect. Her behavior is normal. Judgment and thought content normal.   BP 101/64 mmHg  Pulse 61  Temp(Src) 97.1 F (36.2 C) (Oral)  Ht 5' 4"  (1.626 m)  Wt 134 lb (60.782 kg)  BMI 22.99 kg/m2        Assessment & Plan:   1. Annual physical exam - POCT urinalysis dipstick - POCT UA - Microscopic Only - POCT CBC  2. Encounter for screening mammogram for breast cancer - MM Digital Screening; Future  3. Encounter for routine gynecological examination - Pap IG w/ reflex to HPV when ASC-U  4. Essential hypertension Do  not add salt to diet - CMP14+EGFR - lisinopril-hydrochlorothiazide (PRINZIDE,ZESTORETIC) 20-12.5 MG per tablet; Take 1 tablet by mouth daily.  Dispense: 30 tablet; Refill: 5  5. Hyperlipidemia with target LDL less  than 100 Watch fats in diet - NMR, lipoprofile - atorvastatin (LIPITOR) 40 MG tablet; Take 1 tablet (40 mg total) by mouth daily.  Dispense: 30 tablet; Refill: 5 - fenofibrate (TRICOR) 145 MG tablet; Take 1 tablet (145 mg total) by mouth daily.  Dispense: 30 tablet; Refill: 5  6. Depression Stress management - escitalopram (LEXAPRO) 20 MG tablet; TAKE 1 TABLET (20 MG TOTAL) BY MOUTH DAILY.  Dispense: 30 tablet; Refill: 5  Flu shot and tetanus today Labs pending Health maintenance reviewed Diet and exercise encouraged Continue all meds Follow up  In 6 months   [place], FNP

## 2014-10-06 ENCOUNTER — Telehealth: Payer: Self-pay | Admitting: *Deleted

## 2014-10-06 LAB — NMR, LIPOPROFILE
Cholesterol: 140 mg/dL (ref 100–199)
HDL Cholesterol by NMR: 31 mg/dL — ABNORMAL LOW (ref >39)
HDL PARTICLE NUMBER: 28 umol/L — AB (ref >=30.5)
LDL Particle Number: 1400 nmol/L — ABNORMAL HIGH (ref <1000)
LDL Size: 19.7 nm (ref >20.5)
LDL-C: 88 mg/dL (ref 0–99)
LP-IR Score: 60 — ABNORMAL HIGH (ref <=45)
SMALL LDL PARTICLE NUMBER: 1053 nmol/L — AB (ref <=527)
TRIGLYCERIDES BY NMR: 103 mg/dL (ref 0–149)

## 2014-10-06 LAB — CMP14+EGFR
ALT: 11 IU/L (ref 0–32)
AST: 22 IU/L (ref 0–40)
Albumin/Globulin Ratio: 1.6 (ref 1.1–2.5)
Albumin: 4.6 g/dL (ref 3.5–5.5)
Alkaline Phosphatase: 63 IU/L (ref 39–117)
BUN / CREAT RATIO: 15 (ref 9–23)
BUN: 9 mg/dL (ref 6–24)
CO2: 25 mmol/L (ref 18–29)
CREATININE: 0.61 mg/dL (ref 0.57–1.00)
Calcium: 9.4 mg/dL (ref 8.7–10.2)
Chloride: 98 mmol/L (ref 97–108)
GFR calc non Af Amer: 110 mL/min/{1.73_m2} (ref >59)
GFR, EST AFRICAN AMERICAN: 127 mL/min/{1.73_m2} (ref >59)
Globulin, Total: 2.8 g/dL (ref 1.5–4.5)
Glucose: 116 mg/dL — ABNORMAL HIGH (ref 65–99)
Potassium: 4.2 mmol/L (ref 3.5–5.2)
Sodium: 137 mmol/L (ref 134–144)
Total Bilirubin: 0.4 mg/dL (ref 0.0–1.2)
Total Protein: 7.4 g/dL (ref 6.0–8.5)

## 2014-10-06 NOTE — Telephone Encounter (Signed)
-----   [facility] Martin, FNP sent at 10/06/2014 10:04 AM EST ----- Cbc normal Kidney and liver function stable ldl particle numbers are ok- has room for improvement Urine clear PAP results pending Continue current meds- low fat diet and exercise and recheck in 3 months

## 2014-10-06 NOTE — Telephone Encounter (Signed)
Pt notified of results Verbalizes understanding 

## 2014-10-07 LAB — PAP IG W/ RFLX HPV ASCU: PAP SMEAR COMMENT: 0

## 2014-10-25 ENCOUNTER — Other Ambulatory Visit: Payer: Self-pay | Admitting: Nurse Practitioner

## 2014-10-25 ENCOUNTER — Ambulatory Visit
Admission: RE | Admit: 2014-10-25 | Discharge: 2014-10-25 | Disposition: A | Discharge status: Home / Self Care | Payer: Medicaid Other | Source: Ambulatory Visit | Attending: Nurse Practitioner | Admitting: Nurse Practitioner

## 2014-10-25 DIAGNOSIS — Z1231 Encounter for screening mammogram for malignant neoplasm of breast: Secondary | ICD-10-CM

## 2014-11-08 ENCOUNTER — Other Ambulatory Visit: Payer: Medicaid Other | Admitting: Nurse Practitioner

## 2014-11-15 ENCOUNTER — Other Ambulatory Visit: Payer: Self-pay | Admitting: Family Medicine

## 2015-05-15 ENCOUNTER — Encounter: Payer: Self-pay | Admitting: Nurse Practitioner

## 2015-05-15 ENCOUNTER — Ambulatory Visit (INDEPENDENT_AMBULATORY_CARE_PROVIDER_SITE_OTHER): Payer: Medicaid Other | Admitting: Nurse Practitioner

## 2015-05-15 VITALS — BP 101/62 | HR 74 | Temp 97.3°F | Ht 64.0 in | Wt 117.0 lb

## 2015-05-15 DIAGNOSIS — B373 Candidiasis of vulva and vagina: Secondary | ICD-10-CM

## 2015-05-15 DIAGNOSIS — N898 Other specified noninflammatory disorders of vagina: Secondary | ICD-10-CM

## 2015-05-15 DIAGNOSIS — B3731 Acute candidiasis of vulva and vagina: Secondary | ICD-10-CM

## 2015-05-15 DIAGNOSIS — F329 Major depressive disorder, single episode, unspecified: Secondary | ICD-10-CM | POA: Diagnosis not present

## 2015-05-15 DIAGNOSIS — F32A Depression, unspecified: Secondary | ICD-10-CM

## 2015-05-15 LAB — POCT WET PREP (WET MOUNT)

## 2015-05-15 MED ORDER — VILAZODONE HCL 20 MG PO TABS: 1.0000 | ORAL_TABLET | Freq: Every day | ORAL | Status: DC

## 2015-05-15 MED ORDER — FLUCONAZOLE 150 MG PO TABS: ORAL_TABLET | ORAL | Status: DC

## 2015-05-15 NOTE — Patient Instructions (Signed)
Stress and Stress Management Stress is a normal reaction to life events. It is what you feel when life demands more than you are used to or more than you can handle. Some stress can be useful. For example, the stress reaction can help you catch the last bus of the day, study for a test, or meet a deadline at work. But stress that occurs too often or for too long can cause problems. It can affect your emotional health and interfere with relationships and normal daily activities. Too much stress can weaken your immune system and increase your risk for physical illness. If you already have a medical problem, stress can make it worse. CAUSES  All sorts of life events may cause stress. An event that causes stress for one person may not be stressful for another person. Major life events commonly cause stress. These may be positive or negative. Examples include losing your job, moving into a new home, getting married, having a baby, or losing a loved one. Less obvious life events may also cause stress, especially if they occur day after day or in combination. Examples include working long hours, driving in traffic, caring for children, being in debt, or being in a difficult relationship. SIGNS AND SYMPTOMS Stress may cause emotional symptoms including, the following:  Anxiety. This is feeling worried, afraid, on edge, overwhelmed, or out of control.  Anger. This is feeling irritated or impatient.  Depression. This is feeling sad, down, helpless, or guilty.  Difficulty focusing, remembering, or making decisions. Stress may cause physical symptoms, including the following:   Aches and pains. These may affect your head, neck, back, stomach, or other areas of your body.  Tight muscles or clenched jaw.  Low energy or trouble sleeping. Stress may cause unhealthy behaviors, including the following:   Eating to feel better (overeating) or skipping meals.  Sleeping too little, too much, or both.  Working  too much or putting off tasks (procrastination).  Smoking, drinking alcohol, or using drugs to feel better. DIAGNOSIS  Stress is diagnosed through an assessment by your health care provider. Your health care provider will ask questions about your symptoms and any stressful life events.Your health care provider will also ask about your medical history and may order blood tests or other tests. Certain medical conditions and medicine can cause physical symptoms similar to stress. Mental illness can cause emotional symptoms and unhealthy behaviors similar to stress. Your health care provider may refer you to a mental health professional for further evaluation.  TREATMENT  Stress management is the recommended treatment for stress.The goals of stress management are reducing stressful life events and coping with stress in healthy ways.  Techniques for reducing stressful life events include the following:  Stress identification. Self-monitor for stress and identify what causes stress for you. These skills may help you to avoid some stressful events.  Time management. Set your priorities, keep a calendar of events, and learn to say "no." These tools can help you avoid making too many commitments. Techniques for coping with stress include the following:  Rethinking the problem. Try to think realistically about stressful events rather than ignoring them or overreacting. Try to find the positives in a stressful situation rather than focusing on the negatives.  Exercise. Physical exercise can release both physical and emotional tension. The key is to find a form of exercise you enjoy and do it regularly.  Relaxation techniques. These relax the body and mind. Examples include yoga, meditation, tai chi, biofeedback, deep  breathing, progressive muscle relaxation, listening to music, being out in nature, journaling, and other hobbies. Again, the key is to find one or more that you enjoy and can do  regularly.  Healthy lifestyle. Eat a balanced diet, get plenty of sleep, and do not smoke. Avoid using alcohol or drugs to relax.  Strong support network. Spend time with family, friends, or other people you enjoy being around.Express your feelings and talk things over with someone you trust. Counseling or talktherapy with a mental health professional may be helpful if you are having difficulty managing stress on your own. Medicine is typically not recommended for the treatment of stress.Talk to your health care provider if you think you need medicine for symptoms of stress. HOME CARE INSTRUCTIONS  Keep all follow-up visits as directed by your health care provider.  Take all medicines as directed by your health care provider. SEEK MEDICAL CARE IF:  Your symptoms get worse or you start having new symptoms.  You feel overwhelmed by your problems and can no longer manage them on your own. SEEK IMMEDIATE MEDICAL CARE IF:  You feel like hurting yourself or someone else. Document Released: 03/05/2001 Document Revised: 01/24/2014 Document Reviewed: 05/04/2013 Exit[employer] Patient Information 2015 [employer]. This information is not intended to replace advice given to you by your health care provider. Make sure you discuss any questions you have with your health care provider.  

## 2015-05-15 NOTE — Progress Notes (Signed)
   Subjective:    Patient ID: Lindsay Dickerson, female    DOB: Aug 27, 1971, 44 y.o.   MRN: [id number]  HPI Patient in today c/o : - vaginal discharge- Has been constant for several months- usually if she uses metorgel usually will helped but nw doesn't seem  To help. - depression- lexapro not working anymore- says that she doesn't feel like getting out of bed anymore. denies suicide thoughts. Has been on lexapo for awhile.    Review of Systems  Constitutional: Negative.   HENT: Negative.   Respiratory: Negative.   Cardiovascular: Negative.   Genitourinary: Negative.   Neurological: Negative.   Psychiatric/Behavioral: Negative for suicidal ideas. The patient is nervous/anxious.   All other systems reviewed and are negative.      Objective:   Physical Exam  Constitutional: She is oriented to person, place, and time. She appears well-developed and well-nourished.  Genitourinary:  self wet prep  Neurological: She is alert and oriented to person, place, and time.  Skin: Skin is warm and dry.  Psychiatric: She has a normal mood and affect. Her behavior is normal. Judgment and thought content normal.    BP 101/62 mmHg  Pulse 74  Temp(Src) 97.3 F (36.3 C) (Oral)  Ht  (1.626 m)  Wt 117 lb (53.071 kg)  BMI 20.07 kg/m2         Assessment & Plan:  1. Depression Stress management - Vilazodone HCl (VIIBRYD) 20 MG TABS; Take 1 tablet (20 mg total) by mouth daily.  Dispense: 30 tablet; Refill: 5  2. Vaginal discharge - POCT Wet Prep [facility])  3. Vaginal candidiasis No douching No bubble baths - fluconazole (DIFLUCAN) 150 MG tablet; 1 po q week x 4 weeks  Dispense: 4 tablet; Refill: 0  Mary-Margaret Daphine Deutscher, FNP

## 2015-05-16 ENCOUNTER — Other Ambulatory Visit: Payer: Self-pay | Admitting: Nurse Practitioner

## 2015-05-17 ENCOUNTER — Telehealth: Payer: Self-pay | Admitting: Nurse Practitioner

## 2015-05-17 NOTE — Telephone Encounter (Signed)
Cannot fill lipitor or fenofinfibrate until makes appointmnet for follow up and labs

## 2015-05-17 NOTE — Telephone Encounter (Signed)
Last seen 05/15/15  MMM Last lipid 10/05/14

## 2015-05-18 ENCOUNTER — Ambulatory Visit (INDEPENDENT_AMBULATORY_CARE_PROVIDER_SITE_OTHER): Payer: Medicaid Other | Admitting: Nurse Practitioner

## 2015-05-18 ENCOUNTER — Encounter: Payer: Self-pay | Admitting: Nurse Practitioner

## 2015-05-18 VITALS — BP 93/60 | HR 86 | Temp 97.3°F | Ht 64.0 in | Wt 116.0 lb

## 2015-05-18 DIAGNOSIS — E785 Hyperlipidemia, unspecified: Secondary | ICD-10-CM | POA: Diagnosis not present

## 2015-05-18 DIAGNOSIS — F32A Depression, unspecified: Secondary | ICD-10-CM

## 2015-05-18 DIAGNOSIS — F329 Major depressive disorder, single episode, unspecified: Secondary | ICD-10-CM

## 2015-05-18 DIAGNOSIS — I10 Essential (primary) hypertension: Secondary | ICD-10-CM | POA: Diagnosis not present

## 2015-05-18 MED ORDER — LISINOPRIL-HYDROCHLOROTHIAZIDE 20-12.5 MG PO TABS: 1.0000 | ORAL_TABLET | Freq: Every day | ORAL | Status: DC

## 2015-05-18 MED ORDER — FENOFIBRATE 145 MG PO TABS: 145.0000 mg | ORAL_TABLET | Freq: Every day | ORAL | Status: DC

## 2015-05-18 MED ORDER — ATORVASTATIN CALCIUM 40 MG PO TABS: 40.0000 mg | ORAL_TABLET | Freq: Every day | ORAL | Status: DC

## 2015-05-18 NOTE — Patient Instructions (Signed)

## 2015-05-18 NOTE — Progress Notes (Signed)
Subjective:    Patient ID: Lindsay Dickerson, female    DOB: 06/12/1971, 44 y.o.   MRN: [id number]   Patient here today for follow up of chronic medical problems.   Hypertension This is a chronic problem. The current episode started more than 1 year ago. The problem is unchanged. The problem is controlled. Pertinent negatives include no chest pain, headaches, neck pain, palpitations or shortness of breath. Risk factors for coronary artery disease include dyslipidemia. Past treatments include Lindsay inhibitors and diuretics. The current treatment provides significant improvement. Compliance problems include diet.   Hyperlipidemia This is a chronic problem. The current episode started more than 1 year ago. The problem is uncontrolled. Recent lipid tests were reviewed and are variable. Pertinent negatives include no chest pain or shortness of breath. Current antihyperlipidemic treatment includes statins. The current treatment provides moderate improvement of lipids. There are no compliance problems.  Risk factors for coronary artery disease include dyslipidemia and hypertension.  Depression Has been on lexapro for several years- is doing well without complaints  Review of Systems  Constitutional: Negative.   HENT: Negative.   Respiratory: Negative.  Negative for shortness of breath.   Cardiovascular: Negative for chest pain and palpitations.  Genitourinary: Negative.   Musculoskeletal: Negative for neck pain.  Neurological: Negative.  Negative for headaches.  Psychiatric/Behavioral: Negative.   All other systems reviewed and are negative.      Objective:   Physical Exam  Constitutional: She is oriented to person, place, and time. She appears well-developed and well-nourished.  HENT:  Head: Normocephalic.  Right Ear: Hearing, tympanic membrane, external ear and ear canal normal.  Left Ear: Hearing, tympanic membrane, external ear and ear canal normal.  Nose: Nose normal.  Mouth/Throat:  Uvula is midline and oropharynx is clear and moist.  Eyes: Conjunctivae and EOM are normal. Pupils are equal, round, and reactive to light.  Neck: Trachea normal, normal range of motion and full passive range of motion without pain. Neck supple. No JVD present. Carotid bruit is not present. No thyroid mass and no thyromegaly present.  Cardiovascular: Normal rate, regular rhythm, normal heart sounds and intact distal pulses.  Exam reveals no gallop and no friction rub.   No murmur heard. Pulmonary/Chest: Effort normal and breath sounds normal.  Abdominal: Soft. Bowel sounds are normal. She exhibits no distension and no mass. There is no tenderness.  Genitourinary: No breast swelling, tenderness, discharge or bleeding.  Musculoskeletal: Normal range of motion.  Lymphadenopathy:    She has no cervical adenopathy.  Neurological: She is alert and oriented to person, place, and time. She has normal reflexes.  Skin: Skin is warm and dry.  Psychiatric: She has a normal mood and affect. Her behavior is normal. Judgment and thought content normal.   BP 93/60 mmHg  Pulse 86  Temp(Src) 97.3 F (36.3 C) (Oral)  Ht 5' 4"  (1.626 m)  Wt 116 lb (52.617 kg)  BMI 19.90 kg/m2        Assessment & Plan:   1. Essential hypertension Do not add salt todiet - lisinopril-hydrochlorothiazide (PRINZIDE,ZESTORETIC) 20-12.5 MG per tablet; Take 1 tablet by mouth daily.  Dispense: 30 tablet; Refill: 5 - CMP14+EGFR  2. Hyperlipidemia with target LDL less than 100 Low fat diet - fenofibrate (TRICOR) 145 MG tablet; Take 1 tablet (145 mg total) by mouth daily.  Dispense: 30 tablet; Refill: 5 - atorvastatin (LIPITOR) 40 MG tablet; Take 1 tablet (40 mg total) by mouth daily.  Dispense: 30 tablet; Refill:  5 - Lipid panel  3. Depression Stress management    Labs pending Health maintenance reviewed Diet and exercise encouraged Continue all meds Follow up  In 6 months   [place], FNP

## 2015-05-19 LAB — CMP14+EGFR
A/G RATIO: 1.7 (ref 1.1–2.5)
ALT: 17 IU/L (ref 0–32)
AST: 26 IU/L (ref 0–40)
Albumin: 4.5 g/dL (ref 3.5–5.5)
Alkaline Phosphatase: 27 IU/L — ABNORMAL LOW (ref 39–117)
BUN/Creatinine Ratio: 14 (ref 9–23)
BUN: 10 mg/dL (ref 6–24)
Bilirubin Total: 0.3 mg/dL (ref 0.0–1.2)
CO2: 25 mmol/L (ref 18–29)
Calcium: 9.2 mg/dL (ref 8.7–10.2)
Chloride: 101 mmol/L (ref 97–108)
Creatinine, Ser: 0.69 mg/dL (ref 0.57–1.00)
GFR calc non Af Amer: 105 mL/min/{1.73_m2} (ref >59)
GFR, EST AFRICAN AMERICAN: 121 mL/min/{1.73_m2} (ref >59)
Globulin, Total: 2.6 g/dL (ref 1.5–4.5)
Glucose: 93 mg/dL (ref 65–99)
POTASSIUM: 4.2 mmol/L (ref 3.5–5.2)
Sodium: 140 mmol/L (ref 134–144)
TOTAL PROTEIN: 7.1 g/dL (ref 6.0–8.5)

## 2015-05-19 LAB — LIPID PANEL
CHOLESTEROL TOTAL: 119 mg/dL (ref 100–199)
Chol/HDL Ratio: 39.7 ratio units — ABNORMAL HIGH (ref 0.0–4.4)
HDL: 3 mg/dL — ABNORMAL LOW (ref >39)
LDL Calculated: 84 mg/dL (ref 0–99)
Triglycerides: 160 mg/dL — ABNORMAL HIGH (ref 0–149)
VLDL Cholesterol Cal: 32 mg/dL (ref 5–40)

## 2015-05-21 ENCOUNTER — Other Ambulatory Visit: Payer: Self-pay | Admitting: Nurse Practitioner

## 2015-05-23 ENCOUNTER — Telehealth: Payer: Self-pay | Admitting: Nurse Practitioner

## 2015-05-23 ENCOUNTER — Telehealth: Payer: Self-pay

## 2015-05-23 DIAGNOSIS — E785 Hyperlipidemia, unspecified: Secondary | ICD-10-CM

## 2015-05-23 MED ORDER — FENOFIBRATE 145 MG PO TABS: 145.0000 mg | ORAL_TABLET | Freq: Every day | ORAL | Status: DC

## 2015-05-23 MED ORDER — VENLAFAXINE HCL ER 75 MG PO CP24: 75.0000 mg | ORAL_CAPSULE | Freq: Every day | ORAL | Status: DC

## 2015-05-23 NOTE — Telephone Encounter (Signed)
Sent in rx for effexor rx

## 2015-05-23 NOTE — Telephone Encounter (Signed)
Pt has tried and taken off zoloft, paxil and wellbutrin. Has not heard of effexor or prozac

## 2015-05-23 NOTE — Telephone Encounter (Signed)
Medicaid non preferred is Fenofibrate 145  Preferred are gemfibrozil, Tricor and Trilipix

## 2015-05-23 NOTE — Telephone Encounter (Signed)
Filled fenofibrate- ask patient if she has tried zoloft or paxil in the past

## 2015-05-23 NOTE — Telephone Encounter (Signed)
Pt aware effexor sent to pharmacy, will make an appt in about 6wks from starting it

## 2015-06-10 ENCOUNTER — Other Ambulatory Visit: Payer: Self-pay | Admitting: Nurse Practitioner

## 2015-06-17 ENCOUNTER — Other Ambulatory Visit: Payer: Self-pay | Admitting: Nurse Practitioner

## 2015-06-28 ENCOUNTER — Ambulatory Visit (INDEPENDENT_AMBULATORY_CARE_PROVIDER_SITE_OTHER): Payer: Medicaid Other | Admitting: Family Medicine

## 2015-06-28 ENCOUNTER — Encounter (INDEPENDENT_AMBULATORY_CARE_PROVIDER_SITE_OTHER): Payer: Self-pay

## 2015-06-28 ENCOUNTER — Telehealth: Payer: Self-pay | Admitting: Nurse Practitioner

## 2015-06-28 ENCOUNTER — Other Ambulatory Visit: Payer: Self-pay | Admitting: *Deleted

## 2015-06-28 DIAGNOSIS — R300 Dysuria: Secondary | ICD-10-CM

## 2015-06-28 LAB — POCT UA - MICROSCOPIC ONLY
CASTS, UR, LPF, POC: NEGATIVE
CRYSTALS, UR, HPF, POC: NEGATIVE
Epithelial cells, urine per micros: NEGATIVE
Mucus, UA: NEGATIVE
RBC, URINE, MICROSCOPIC: NEGATIVE
YEAST UA: NEGATIVE

## 2015-06-28 LAB — POCT URINALYSIS DIPSTICK
Bilirubin, UA: NEGATIVE
Glucose, UA: NEGATIVE
KETONES UA: NEGATIVE
Leukocytes, UA: NEGATIVE
NITRITE UA: NEGATIVE
PH UA: 6.5
Protein, UA: NEGATIVE
RBC UA: NEGATIVE
Spec Grav, UA: <=1.005
Urobilinogen, UA: NEGATIVE

## 2015-06-28 NOTE — Telephone Encounter (Signed)
Offered patient appt for PM clinic but only wants to see MMM. Advised to come to office and leave urine and I will have MMM review in the AM. Patient verbalized understanding

## 2015-08-28 ENCOUNTER — Telehealth: Payer: Self-pay | Admitting: Nurse Practitioner

## 2015-09-27 NOTE — Progress Notes (Signed)
Housekeeping note to close encounter, OV created for lab appt.  I had no contribution to visit.   Murtis SinkSam Bradshaw, MD [facility] 09/27/2015, 5:33 PM

## 2015-10-05 ENCOUNTER — Other Ambulatory Visit: Payer: Self-pay | Admitting: Nurse Practitioner

## 2015-10-25 ENCOUNTER — Ambulatory Visit: Payer: Medicaid Other | Admitting: Nurse Practitioner

## 2015-10-26 ENCOUNTER — Ambulatory Visit: Payer: Medicaid Other | Admitting: Nurse Practitioner

## 2015-10-27 ENCOUNTER — Encounter: Payer: Self-pay | Admitting: Nurse Practitioner

## 2015-11-05 ENCOUNTER — Other Ambulatory Visit: Payer: Self-pay | Admitting: Nurse Practitioner

## 2015-11-30 ENCOUNTER — Other Ambulatory Visit: Payer: Self-pay | Admitting: Nurse Practitioner

## 2015-12-06 ENCOUNTER — Other Ambulatory Visit: Payer: Self-pay | Admitting: Nurse Practitioner

## 2015-12-07 NOTE — Telephone Encounter (Signed)
Last seen 06/28/15  MMM

## 2015-12-28 ENCOUNTER — Other Ambulatory Visit: Payer: Self-pay | Admitting: Nurse Practitioner

## 2016-01-07 ENCOUNTER — Other Ambulatory Visit: Payer: Self-pay | Admitting: Nurse Practitioner

## 2016-01-08 NOTE — Telephone Encounter (Signed)
Last seen 06/28/15   MMM

## 2016-01-09 ENCOUNTER — Other Ambulatory Visit: Payer: Self-pay | Admitting: Nurse Practitioner

## 2016-01-30 ENCOUNTER — Other Ambulatory Visit: Payer: Self-pay | Admitting: Nurse Practitioner

## 2016-02-08 ENCOUNTER — Other Ambulatory Visit: Payer: Self-pay | Admitting: Nurse Practitioner

## 2016-02-08 NOTE — Telephone Encounter (Signed)
Last seen 06/28/15  MMM

## 2016-03-04 ENCOUNTER — Other Ambulatory Visit: Payer: Self-pay | Admitting: Nurse Practitioner

## 2016-03-05 NOTE — Telephone Encounter (Signed)
Last seen 10./5/16 Dr Dettinger   MMM PCP   Last lipid 05/18/15 

## 2016-04-05 ENCOUNTER — Other Ambulatory Visit: Payer: Self-pay | Admitting: Family

## 2016-04-05 NOTE — Telephone Encounter (Signed)
Last seen 10./5/16 Dr Dettinger   MMM PCP   Last lipid 05/18/15

## 2016-04-09 ENCOUNTER — Other Ambulatory Visit: Payer: Self-pay | Admitting: Family

## 2016-05-09 ENCOUNTER — Ambulatory Visit: Payer: Medicaid Other | Admitting: Nurse Practitioner

## 2016-05-14 ENCOUNTER — Ambulatory Visit (INDEPENDENT_AMBULATORY_CARE_PROVIDER_SITE_OTHER): Payer: Medicaid Other | Admitting: Nurse Practitioner

## 2016-05-14 ENCOUNTER — Encounter: Payer: Self-pay | Admitting: Nurse Practitioner

## 2016-05-14 VITALS — BP 105/73 | HR 75 | Temp 96.7°F | Ht 64.0 in | Wt 115.0 lb

## 2016-05-14 DIAGNOSIS — L409 Psoriasis, unspecified: Secondary | ICD-10-CM

## 2016-05-14 DIAGNOSIS — F329 Major depressive disorder, single episode, unspecified: Secondary | ICD-10-CM | POA: Diagnosis not present

## 2016-05-14 DIAGNOSIS — I10 Essential (primary) hypertension: Secondary | ICD-10-CM | POA: Diagnosis not present

## 2016-05-14 DIAGNOSIS — E785 Hyperlipidemia, unspecified: Secondary | ICD-10-CM | POA: Diagnosis not present

## 2016-05-14 DIAGNOSIS — F32A Depression, unspecified: Secondary | ICD-10-CM

## 2016-05-14 MED ORDER — FENOFIBRATE 145 MG PO TABS: 145.0000 mg | ORAL_TABLET | Freq: Every day | ORAL | 5 refills | Status: DC

## 2016-05-14 MED ORDER — LISINOPRIL-HYDROCHLOROTHIAZIDE 20-12.5 MG PO TABS: 1.0000 | ORAL_TABLET | Freq: Every day | ORAL | 5 refills | Status: DC

## 2016-05-14 MED ORDER — ATORVASTATIN CALCIUM 40 MG PO TABS: 40.0000 mg | ORAL_TABLET | Freq: Every day | ORAL | 5 refills | Status: DC

## 2016-05-14 MED ORDER — TRIAMCINOLONE ACETONIDE 0.1 % EX CREA: 1.0000 "application " | TOPICAL_CREAM | Freq: Two times a day (BID) | CUTANEOUS | 1 refills | Status: DC

## 2016-05-14 MED ORDER — METRONIDAZOLE 0.75 % VA GEL: Freq: Every day | VAGINAL | 4 refills | Status: DC

## 2016-05-14 MED ORDER — VENLAFAXINE HCL ER 75 MG PO CP24: 75.0000 mg | ORAL_CAPSULE | Freq: Every day | ORAL | 5 refills | Status: DC

## 2016-05-14 NOTE — Addendum Note (Signed)
Addended by: Bennie PieriniMARTIN, MARY-MARGARET on: 05/14/2016 12:47 PM   Modules accepted: Orders

## 2016-05-14 NOTE — Progress Notes (Signed)
Subjective:    Patient ID: Lindsay Dickerson, female    DOB: [birth year], 45 y.o.   MRN: [id number]   Patient here today for follow up of chronic medical problems.  Outpatient Encounter Prescriptions as of 05/14/2016  Medication Sig  . atorvastatin (LIPITOR) 40 MG tablet Take 1 tablet (40 mg total) by mouth daily at 6 PM.  . fenofibrate (TRICOR) 145 MG tablet Take 1 tablet (145 mg total) by mouth daily.  Marland Kitchen lisinopril-hydrochlorothiazide (PRINZIDE,ZESTORETIC) 20-12.5 MG per tablet Take 1 tablet by mouth daily.  Marland Kitchen METROGEL VAGINAL 0.75 % vaginal gel PLACE 1 APPLICATORFUL VAGINALLY 2 (TWO) TIMES DAILY.  Marland Kitchen venlafaxine XR (EFFEXOR-XR) 75 MG 24 hr capsule Take 1 capsule (75 mg total) by mouth daily with breakfast.     Hypertension  This is a chronic problem. The current episode started more than 1 year ago. The problem is unchanged. The problem is controlled. Pertinent negatives include no chest pain, headaches, neck pain, palpitations or shortness of breath. Risk factors for coronary artery disease include dyslipidemia. Past treatments include Lindsay inhibitors and diuretics. The current treatment provides significant improvement. Compliance problems include diet.   Hyperlipidemia  This is a chronic problem. The current episode started more than 1 year ago. The problem is uncontrolled. Recent lipid tests were reviewed and are variable. Pertinent negatives include no chest pain or shortness of breath. Current antihyperlipidemic treatment includes statins. The current treatment provides moderate improvement of lipids. There are no compliance problems.  Risk factors for coronary artery disease include dyslipidemia and hypertension.  Depression Has been on lexapro for several years- she is having a hard time right now because her son OD on heroin about 4 months ago.  Review of Systems  Constitutional: Negative.   HENT: Negative.   Respiratory: Negative.  Negative for shortness of breath.     Cardiovascular: Negative for chest pain and palpitations.  Genitourinary: Negative.   Musculoskeletal: Negative for neck pain.  Neurological: Negative.  Negative for headaches.  Psychiatric/Behavioral: Negative.   All other systems reviewed and are negative.      Objective:   Physical Exam  Constitutional: She is oriented to person, place, and time. She appears well-developed and well-nourished.  HENT:  Head: Normocephalic.  Right Ear: Hearing, tympanic membrane, external ear and ear canal normal.  Left Ear: Hearing, tympanic membrane, external ear and ear canal normal.  Nose: Nose normal.  Mouth/Throat: Uvula is midline and oropharynx is clear and moist.  Eyes: Conjunctivae and EOM are normal. Pupils are equal, round, and reactive to light.  Neck: Trachea normal, normal range of motion and full passive range of motion without pain. Neck supple. No JVD present. Carotid bruit is not present. No thyroid mass and no thyromegaly present.  Cardiovascular: Normal rate, regular rhythm, normal heart sounds and intact distal pulses.  Exam reveals no gallop and no friction rub.   No murmur heard. Pulmonary/Chest: Effort normal and breath sounds normal.  Abdominal: Soft. Bowel sounds are normal. She exhibits no distension and no mass. There is no tenderness.  Genitourinary: No breast swelling, tenderness, discharge or bleeding.  Musculoskeletal: Normal range of motion.  Lymphadenopathy:    She has no cervical adenopathy.  Neurological: She is alert and oriented to person, place, and time. She has normal reflexes.  Skin: Skin is warm and dry.  erythematous dry patch with slight silvery plaque on left lower leg  Psychiatric: She has a normal mood and affect. Her behavior is normal. Judgment and thought content  normal.    BP 105/73   Pulse 75   Temp (!) 96.7 F (35.9 C) (Oral)   Ht 5' 4"  (1.626 m)   Wt 115 lb (52.2 kg)   BMI 19.74 kg/m      Assessment & Plan:   1. Essential  hypertension Do not add salt to diet - CMP14+EGFR - lisinopril-hydrochlorothiazide (PRINZIDE,ZESTORETIC) 20-12.5 MG tablet; Take 1 tablet by mouth daily.  Dispense: 30 tablet; Refill: 5  2. Depression Stress management - venlafaxine XR (EFFEXOR-XR) 75 MG 24 hr capsule; Take 1 capsule (75 mg total) by mouth daily with breakfast.  Dispense: 30 capsule; Refill: 5  3. Hyperlipidemia with target LDL less than 100 Low fat diet - fenofibrate (TRICOR) 145 MG tablet; Take 1 tablet (145 mg total) by mouth daily.  Dispense: 30 tablet; Refill: 5 - atorvastatin (LIPITOR) 40 MG tablet; Take 1 tablet (40 mg total) by mouth daily at 6 PM.  Dispense: 30 tablet; Refill: 5 - Lipid panel  4. Psoriasis Avoid scratching - triamcinolone ream qd with eucerin cream.    Labs pending Health maintenance reviewed Diet and exercise encouraged Continue all meds Follow up  In 3 months   [place], FNP

## 2016-05-15 LAB — CMP14+EGFR
ALT: 13 IU/L (ref 0–32)
AST: 24 IU/L (ref 0–40)
Albumin/Globulin Ratio: 1.8 (ref 1.2–2.2)
Albumin: 5 g/dL (ref 3.5–5.5)
Alkaline Phosphatase: 34 IU/L — ABNORMAL LOW (ref 39–117)
BUN/Creatinine Ratio: 21 (ref 9–23)
BUN: 17 mg/dL (ref 6–24)
Bilirubin Total: 0.4 mg/dL (ref 0.0–1.2)
CALCIUM: 10 mg/dL (ref 8.7–10.2)
CO2: 23 mmol/L (ref 18–29)
Chloride: 94 mmol/L — ABNORMAL LOW (ref 96–106)
Creatinine, Ser: 0.8 mg/dL (ref 0.57–1.00)
GFR, EST AFRICAN AMERICAN: 102 mL/min/{1.73_m2} (ref >59)
GFR, EST NON AFRICAN AMERICAN: 88 mL/min/{1.73_m2} (ref >59)
GLOBULIN, TOTAL: 2.8 g/dL (ref 1.5–4.5)
Glucose: 103 mg/dL — ABNORMAL HIGH (ref 65–99)
POTASSIUM: 4.2 mmol/L (ref 3.5–5.2)
SODIUM: 134 mmol/L (ref 134–144)
Total Protein: 7.8 g/dL (ref 6.0–8.5)

## 2016-05-15 LAB — LIPID PANEL
Chol/HDL Ratio: 23.4 ratio units — ABNORMAL HIGH (ref 0.0–4.4)
Cholesterol, Total: 117 mg/dL (ref 100–199)
HDL: 5 mg/dL — ABNORMAL LOW (ref >39)
LDL Calculated: 87 mg/dL (ref 0–99)
Triglycerides: 127 mg/dL (ref 0–149)
VLDL Cholesterol Cal: 25 mg/dL (ref 5–40)

## 2016-05-30 ENCOUNTER — Encounter: Payer: Self-pay | Admitting: *Deleted

## 2016-07-23 ENCOUNTER — Ambulatory Visit (INDEPENDENT_AMBULATORY_CARE_PROVIDER_SITE_OTHER): Payer: Medicaid Other | Admitting: Nurse Practitioner

## 2016-07-23 ENCOUNTER — Encounter: Payer: Self-pay | Admitting: Nurse Practitioner

## 2016-07-23 VITALS — BP 95/62 | HR 67 | Temp 97.2°F | Ht 64.0 in | Wt 115.0 lb

## 2016-07-23 DIAGNOSIS — J0100 Acute maxillary sinusitis, unspecified: Secondary | ICD-10-CM

## 2016-07-23 DIAGNOSIS — J029 Acute pharyngitis, unspecified: Secondary | ICD-10-CM | POA: Diagnosis not present

## 2016-07-23 LAB — CULTURE, GROUP A STREP

## 2016-07-23 LAB — RAPID STREP SCREEN (MED CTR MEBANE ONLY): STREP GP A AG, IA W/REFLEX: NEGATIVE

## 2016-07-23 MED ORDER — BENZONATATE 100 MG PO CAPS: 100.0000 mg | ORAL_CAPSULE | Freq: Three times a day (TID) | ORAL | 0 refills | Status: DC | PRN

## 2016-07-23 MED ORDER — AMOXICILLIN 875 MG PO TABS: 875.0000 mg | ORAL_TABLET | Freq: Two times a day (BID) | ORAL | 0 refills | Status: DC

## 2016-07-23 NOTE — Progress Notes (Signed)
Subjective:     Lindsay Dickerson is a 45 y.o. female here for evaluation of a cough. Onset of symptoms was 5 weeks ago. Symptoms have been unchanged since that time. The cough is dry, painful and productive and is aggravated by nothing. Associated symptoms include: change in voice, chest pain, chills and night sweats. Patient does not have a history of asthma. Patient does have a history of environmental allergens. Patient has traveled recently. Patient does have a history of smoking. Patient has not had a previous chest x-ray. Patient has not had a PPD done.  The following portions of the patient's history were reviewed and updated as appropriate: allergies, current medications, past family history, past medical history, past social history, past surgical history and problem list.  Review of Systems Constitutional: positive for anorexia and fatigue Eyes: negative for irritation, redness and visual disturbance Ears, nose, mouth, throat, and face: positive for hoarseness, sore throat and tinnitus, negative for facial trauma and nasal congestion Respiratory: positive for cough, negative for hemoptysis, stridor and wheezing Cardiovascular: negative for irregular heart beat, near-syncope and palpitations Gastrointestinal: positive for nausea    Objective:    Oxygen saturation 98% on room air General appearance: alert, cooperative, appears stated age and mild distress Head: Normocephalic, without obvious abnormality, atraumatic, sinuses tender to percussion Eyes: conjunctivae/corneas clear. PERRL, EOM's intact. Fundi benign. Ears: normal TM's and external ear canals both ears Nose: Nares normal. Septum midline. Mucosa normal. No drainage or sinus tenderness. Throat: abnormal findings: mild oropharyngeal erythema Neck: no adenopathy, no carotid bruit, no JVD, supple, symmetrical, trachea midline and thyroid not enlarged, symmetric, no tenderness/mass/nodules Lungs: clear to auscultation  bilaterally Heart: regular rate and rhythm, S1, S2 normal, no murmur, click, rub or gallop     BP 95/62   Pulse 67   Temp 97.2 F (36.2 C) (Oral)   Ht 5\' 4"  (1.626 m)   Wt 115 lb (52.2 kg)   BMI 19.74 kg/m   Assessment:    Sinusitis    Plan:     1. Take meds as prescribed 2. Use a cool mist humidifier especially during the winter months and when heat has been humid. 3. Use saline nose sprays frequently 4. Saline irrigations of the nose can be very helpful if done frequently.  * 4X daily for 1 week*  * Use of a nettie pot can be helpful with this. Follow directions with this* 5. Drink plenty of fluids 6. Keep thermostat turn down low 7.For any cough or congestion  Use plain Mucinex- regular strength or max strength is fine   * Children- consult with Pharmacist for dosing 8. For fever or aces or pains- take tylenol or ibuprofen appropriate for age and weight.  * for fevers greater than 101 orally you may alternate ibuprofen and tylenol every  3 hours.   Meds ordered this encounter  Medications  . benzonatate (TESSALON) 100 MG capsule    Sig: Take 1 capsule (100 mg total) by mouth 3 (three) times daily as needed for cough.    Dispense:  20 capsule    Refill:  0  . amoxicillin (AMOXIL) 875 MG tablet    Sig: Take 1 tablet (875 mg total) by mouth 2 (two) times daily. 1 po BID    Dispense:  20 tablet    Refill:  0   Mary-Margaret Daphine DeutscherMartin, FNP

## 2016-07-23 NOTE — Patient Instructions (Signed)

## 2016-07-30 ENCOUNTER — Other Ambulatory Visit: Payer: Self-pay

## 2016-07-30 MED ORDER — FLUCONAZOLE 150 MG PO TABS
150.0000 mg | ORAL_TABLET | Freq: Once | ORAL | 0 refills | Status: AC
Start: 2016-07-30 — End: 2016-07-30

## 2016-08-05 ENCOUNTER — Telehealth: Payer: Self-pay | Admitting: Nurse Practitioner

## 2016-08-05 ENCOUNTER — Ambulatory Visit: Payer: Medicaid Other | Admitting: Physician Assistant

## 2016-08-05 NOTE — Telephone Encounter (Signed)
Pt finished antibiotic Now has sore throat and ear pain Some chills  appt scheduled

## 2016-08-06 ENCOUNTER — Ambulatory Visit (INDEPENDENT_AMBULATORY_CARE_PROVIDER_SITE_OTHER): Payer: Medicaid Other | Admitting: Nurse Practitioner

## 2016-08-06 ENCOUNTER — Ambulatory Visit (INDEPENDENT_AMBULATORY_CARE_PROVIDER_SITE_OTHER): Payer: Medicaid Other

## 2016-08-06 ENCOUNTER — Encounter: Payer: Self-pay | Admitting: Nurse Practitioner

## 2016-08-06 VITALS — BP 98/60 | HR 99 | Temp 97.8°F | Ht 64.0 in | Wt 120.1 lb

## 2016-08-06 DIAGNOSIS — J069 Acute upper respiratory infection, unspecified: Secondary | ICD-10-CM

## 2016-08-06 DIAGNOSIS — J029 Acute pharyngitis, unspecified: Secondary | ICD-10-CM | POA: Diagnosis not present

## 2016-08-06 DIAGNOSIS — R059 Cough, unspecified: Secondary | ICD-10-CM

## 2016-08-06 DIAGNOSIS — R05 Cough: Secondary | ICD-10-CM | POA: Diagnosis not present

## 2016-08-06 LAB — CULTURE, GROUP A STREP

## 2016-08-06 LAB — RAPID STREP SCREEN (MED CTR MEBANE ONLY): Strep Gp A Ag, IA W/Reflex: NEGATIVE

## 2016-08-06 MED ORDER — METHYLPREDNISOLONE ACETATE 80 MG/ML IJ SUSP
80.0000 mg | Freq: Once | INTRAMUSCULAR | Status: AC
  Administered 2016-08-06: 80 mg via INTRAMUSCULAR

## 2016-08-06 MED ORDER — DOXYCYCLINE HYCLATE 100 MG PO TABS: 100.0000 mg | ORAL_TABLET | Freq: Two times a day (BID) | ORAL | 0 refills | Status: DC

## 2016-08-06 NOTE — Patient Instructions (Signed)

## 2016-08-06 NOTE — Progress Notes (Signed)
Subjective:     Lindsay Dickerson is a 45 y.o. female who presents for evaluation of sore throat. Associated symptoms include chest congestion, chest pain during cough, chills, bilateral ear fullness, pain and tinnitus, nasal blockage, post nasal drip, sinus and nasal congestion and sore throat. Onset of symptoms was 2 days ago, and have been rapidly worsening since that time. She is drinking plenty of fluids. She has had a recent close exposure to someone with proven streptococcal pharyngitis. She was recently treated for Sinusitis and just finished a course of 10 days antibiotics. She also recently came back from [place] 5 days ago.  Patient is a half a pack/day cigarette smoker  The following portions of the patient's history were reviewed and updated as appropriate: allergies, current medications, past family history, past medical history, past social history, past surgical history and problem list.  Review of Systems Constitutional: positive for chills, malaise and night sweats, negative for fevers Eyes: negative Ears, nose, mouth, throat, and face: positive for hoarseness, nasal congestion, sore throat and tinnitus, negative for facial trauma, hearing loss and sore mouth Respiratory: positive for cough, dyspnea on exertion and wheezing, negative for asthma, chronic bronchitis, hemoptysis and stridor Cardiovascular: negative Gastrointestinal: negative    Objective:    BP 98/60   Pulse 99   Temp 97.8 F (36.6 C) (Oral)   Ht 5\' 4"  (1.626 m)   Wt 120 lb 2 oz (54.5 kg)   BMI 20.62 kg/m  General appearance: alert, cooperative, appears stated age and mild distress Head: Normocephalic, without obvious abnormality, atraumatic, sinuses nontender to percussion Eyes: conjunctivae/corneas clear. PERRL, EOM's intact. Fundi benign. Ears: normal TM's and external ear canals both ears Nose: clear discharge, turbinates pale, swollen, no sinus tenderness Throat: abnormal findings: mild  oropharyngeal erythema Neck: no adenopathy, no carotid bruit, no JVD, supple, symmetrical, trachea midline and thyroid not enlarged, symmetric, no tenderness/mass/nodules Lungs: diminished breath sounds LLL and rhonchi LUL and RUL Heart: regular rate and rhythm, S1, S2 normal, no murmur, click, rub or gallop  Laboratory Strep test done. Results:negative.    Assessment:      acute bronchitis.    Plan:    Patient placed on antibiotics. Use of OTC analgesics recommended as well as salt water gargles. Follow up as needed.   RTO if symptoms worsens or does not get better after 24hrs of antibiotic use.  Reuel BoomQueeneth Mbemena, RN,BSN, AGNP Student Mary-Margaret Daphine DeutscherMartin, FNP

## 2016-08-09 NOTE — Telephone Encounter (Signed)
Patient was seen for office visit on 08/06/16

## 2016-11-27 ENCOUNTER — Other Ambulatory Visit: Payer: Self-pay | Admitting: Nurse Practitioner

## 2016-11-27 DIAGNOSIS — E785 Hyperlipidemia, unspecified: Secondary | ICD-10-CM

## 2016-11-27 DIAGNOSIS — F32A Depression, unspecified: Secondary | ICD-10-CM

## 2016-11-27 DIAGNOSIS — I10 Essential (primary) hypertension: Secondary | ICD-10-CM

## 2016-11-27 DIAGNOSIS — F329 Major depressive disorder, single episode, unspecified: Secondary | ICD-10-CM

## 2016-12-04 IMAGING — MG MM SCREENING W/IMPLANTS
8 series · 8 of 8 positions shown · non-contrast
Comparison: Prior exams have been purged.

CLINICAL DATA: Screening.

EXAM:
DIGITAL SCREENING BILATERAL MAMMOGRAM WITH IMPLANTS AND CAD
The patient has submuscular saline implants. Standard and implant
displaced views were performed.

[R CC]
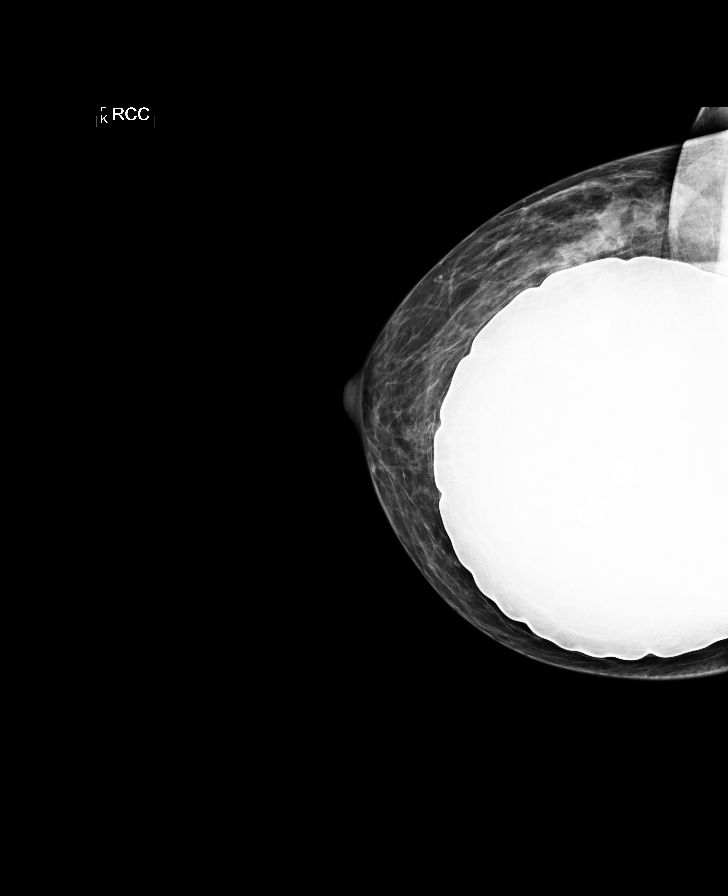

[L CC]
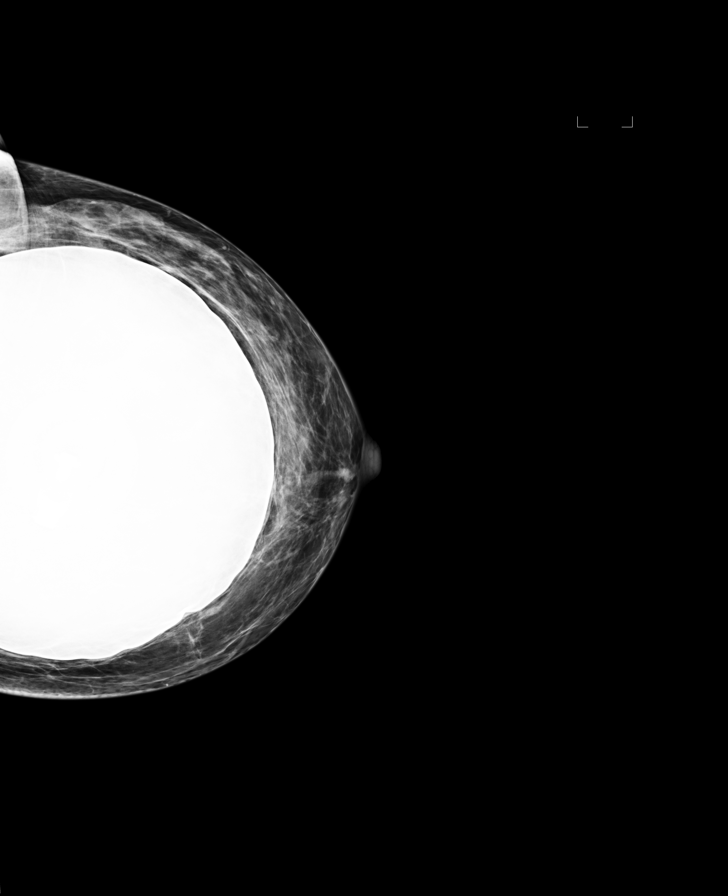

[L MLO]
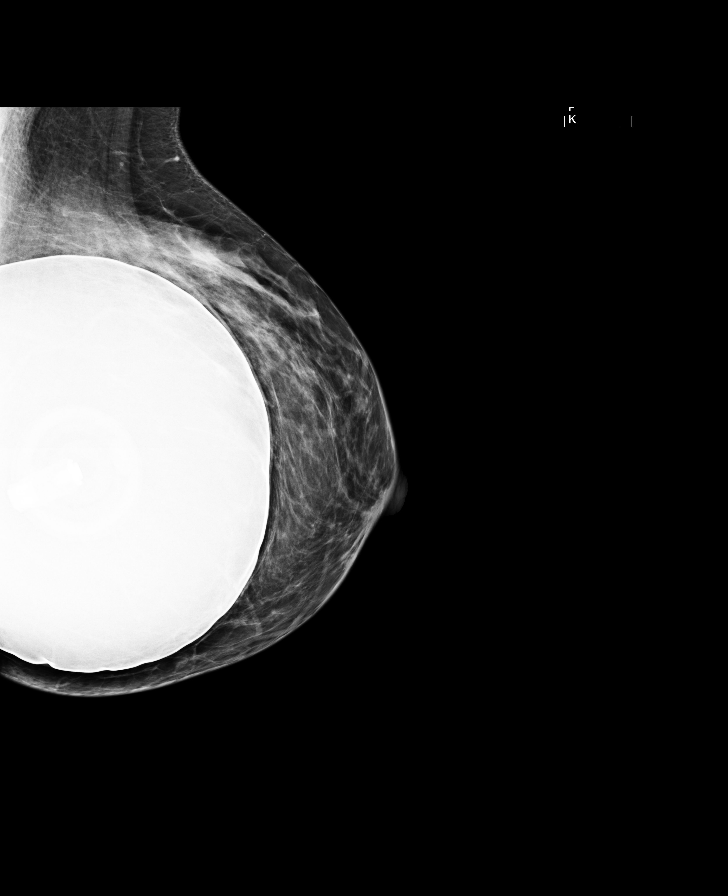

[R MLO]
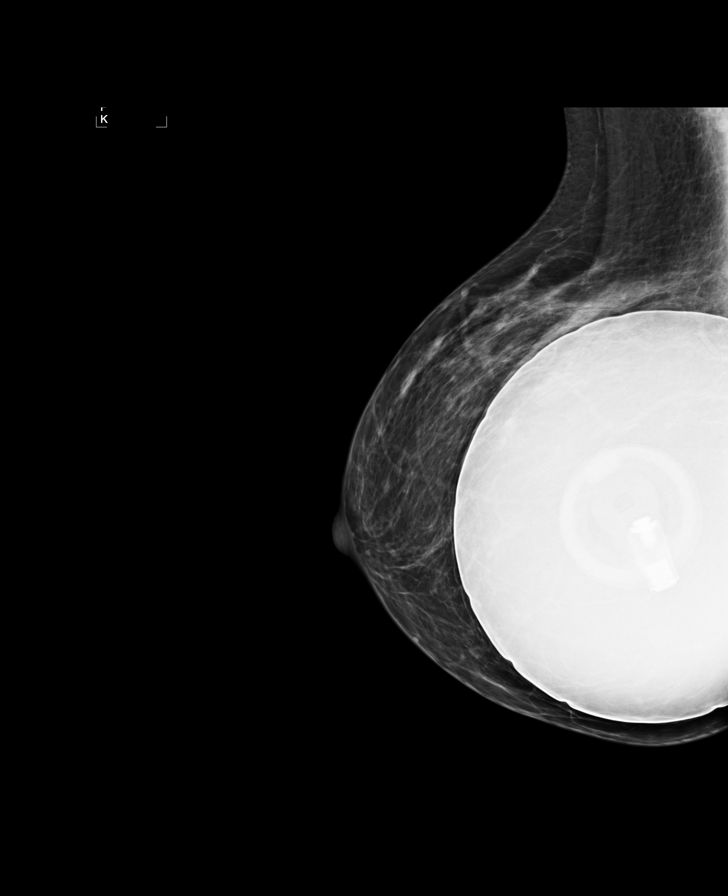

[R CCID]
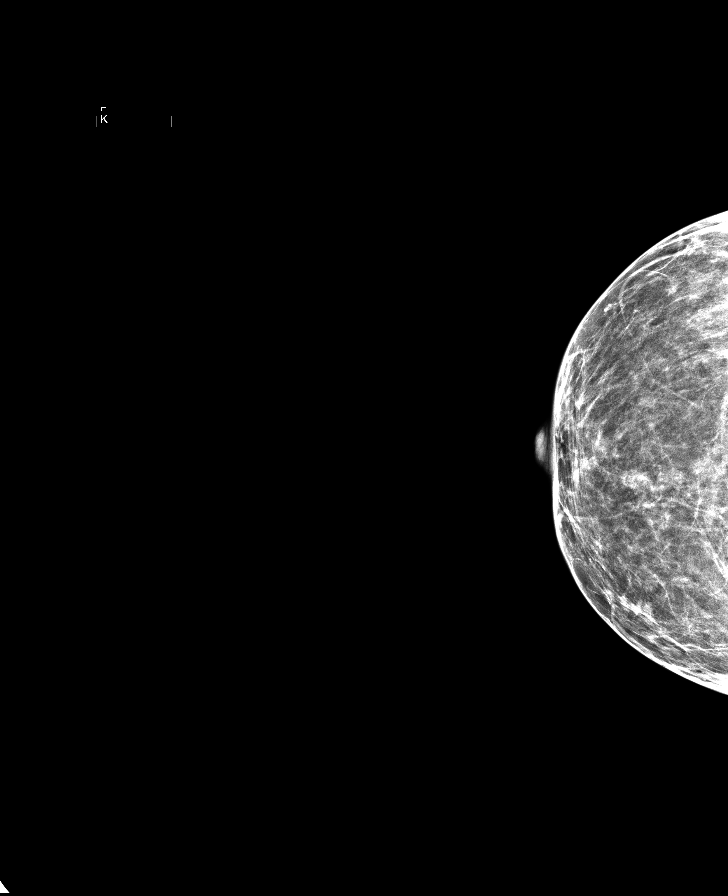

[L CCID]
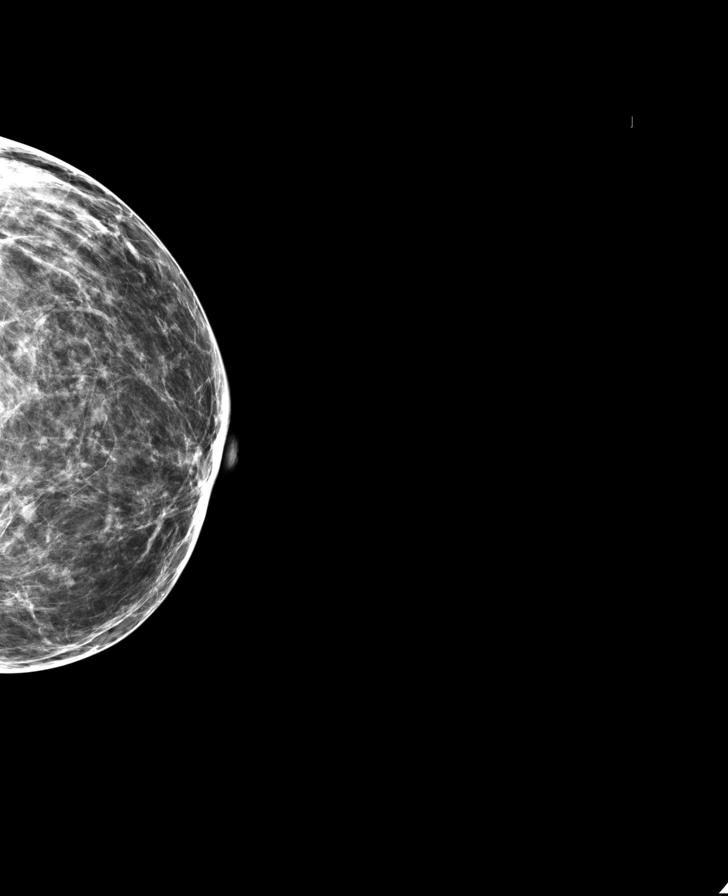

[L MLOID]
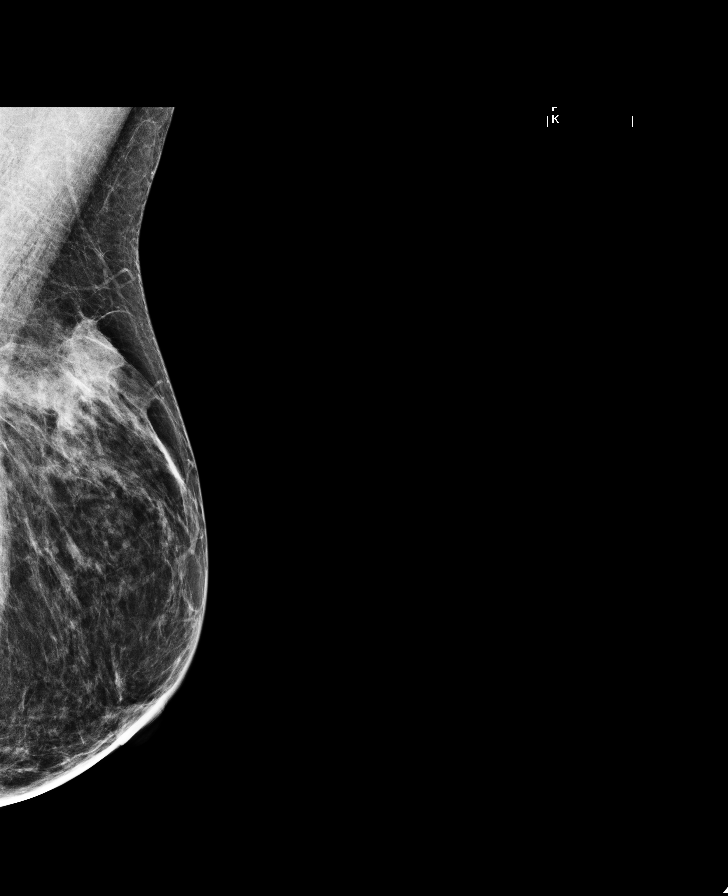

[R MLOID]
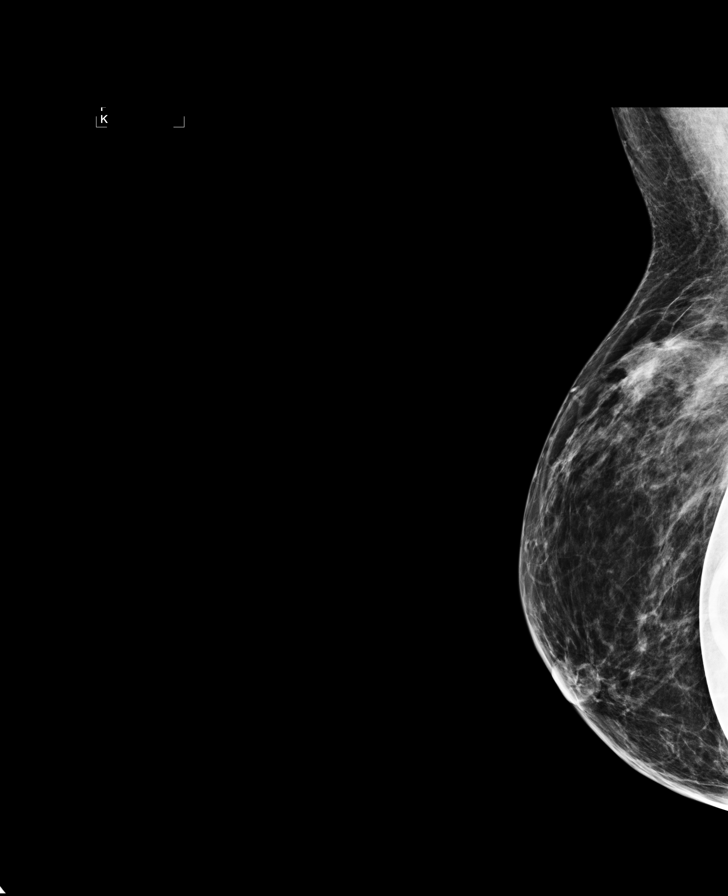

[8 of 8 positions shown; findings below may reference images not displayed]

No current comparison
exam.

ACR Breast Density Category c: The breast tissue is heterogeneously
dense, which may obscure small masses.
FINDINGS: There are no findings suspicious for malignancy. Images were
processed with CAD.
IMPRESSION: No mammographic evidence of malignancy. A result letter of this
screening mammogram will be mailed directly to the patient.

RECOMMENDATION:
Screening mammogram in one year. (Code:[id number])

BI-RADS CATEGORY  1:  Negative.

## 2016-12-13 ENCOUNTER — Other Ambulatory Visit: Payer: Self-pay | Admitting: Nurse Practitioner

## 2016-12-13 DIAGNOSIS — E785 Hyperlipidemia, unspecified: Secondary | ICD-10-CM

## 2016-12-26 ENCOUNTER — Encounter: Payer: Self-pay | Admitting: Nurse Practitioner

## 2016-12-26 ENCOUNTER — Ambulatory Visit (INDEPENDENT_AMBULATORY_CARE_PROVIDER_SITE_OTHER): Payer: Medicaid Other | Admitting: Nurse Practitioner

## 2016-12-26 VITALS — BP 119/77 | HR 72 | Temp 96.7°F | Ht 64.0 in | Wt 133.0 lb

## 2016-12-26 DIAGNOSIS — F319 Bipolar disorder, unspecified: Secondary | ICD-10-CM | POA: Diagnosis not present

## 2016-12-26 DIAGNOSIS — F332 Major depressive disorder, recurrent severe without psychotic features: Secondary | ICD-10-CM | POA: Diagnosis not present

## 2016-12-26 MED ORDER — ALPRAZOLAM 0.25 MG PO TABS: 0.2500 mg | ORAL_TABLET | Freq: Two times a day (BID) | ORAL | 0 refills | Status: DC | PRN

## 2016-12-26 MED ORDER — QUETIAPINE FUMARATE 100 MG PO TABS: 100.0000 mg | ORAL_TABLET | Freq: Every day | ORAL | 0 refills | Status: DC

## 2016-12-26 NOTE — Patient Instructions (Signed)
Bipolar 1 Disorder  Bipolar 1 disorder is a mental health disorder in which a person has episodes of emotional highs (mania), and may also have episodes of emotional lows (depression) in addition to highs. Bipolar 1 disorder is different from other bipolar disorders because it involves extreme manic episodes. These episodes last at least one week or involve symptoms that are so severe that hospitalization is needed to keep the person safe.  What increases the risk?  The cause of this condition is not known. However, certain factors make you more likely to have bipolar disorder, such as:  · Having a family member with the disorder.  · An imbalance of certain chemicals in the brain (neurotransmitters).  · Stress, such as illness, financial problems, or a death.  · Certain conditions that affect the brain or spinal cord (neurologic conditions).  · Brain injury (trauma).  · Having another mental health disorder, such as:  ? Obsessive compulsive disorder.  ? Schizophrenia.    What are the signs or symptoms?  Symptoms of mania include:  · Very high self-esteem or self-confidence.  · Decreased need for sleep.  · Unusual talkativeness or feeling a need to keep talking. Speech may be very fast. It may seem like you cannot stop talking.  · Racing thoughts or constant talking, with quick shifts between topics that may or may not be related (flight of ideas).  · Decreased ability to focus or concentrate.  · Increased purposeful activity, such as work, studies, or social activity.  · Increased nonproductive activity. This could be pacing, squirming and fidgeting, or finger and toe tapping.  · Impulsive behavior and poor judgment. This may result in high-risk activities, such as having unprotected sex or spending a lot of money.    Symptoms of depression include:  · Feeling sad, hopeless, or helpless.  · Frequent or uncontrollable crying.  · Lack of feeling or caring about anything.  · Sleeping too much.  · Moving more slowly  than usual.  · Not being able to enjoy things you used to enjoy.  · Wanting to be alone all the time.  · Feeling guilty or worthless.  · Lack of energy or motivation.  · Trouble concentrating or remembering.  · Trouble making decisions.  · Increased appetite.  · Thoughts of death, or the desire to harm yourself.    Sometimes, you may have a mixed mood. This means having symptoms of depression and mania. Stress can make symptoms worse.  How is this diagnosed?  To diagnose bipolar disorder, your health care provider may ask about your:  · Emotional episodes.  · Medical history.  · Alcohol and drug use. This includes prescription medicines. Certain medical conditions and substances can cause symptoms that seem like bipolar disorder (secondary bipolar disorder).    How is this treated?  Bipolar disorder is a long-term (chronic) illness. It is best controlled with ongoing (continuous) treatment rather than treatment only when symptoms occur. Treatment may include:  · Medicine. Medicine can be prescribed by a provider who specializes in treating mental disorders (psychiatrist).  ? Medicines called mood stabilizers are usually prescribed.  ? If symptoms occur even while taking a mood stabilizer, other medicines may be added.  · Psychotherapy. Some forms of talk therapy, such as cognitive-behavioral therapy (CBT), can provide support, education, and guidance.  · Coping methods, such as journaling or relaxation exercises. These may include:  ? Yoga.  ? Meditation.  ? Deep breathing.  · Lifestyle changes, such as:  ?   Limiting alcohol and drug use.  ? Exercising regularly.  ? Getting plenty of sleep.  ? Making healthy eating choices.    A combination of medicine, talk therapy, and coping methods is best. A procedure in which electricity is applied to the brain through the scalp (electroconvulsive therapy) may be used in cases of severe mania when medicine and psychotherapy work too slowly or do not work.  Follow these  instructions at home:  Activity     · Return to your normal activities as told by your health care provider.  · Find activities that you enjoy, and make time to do them.  · Exercise regularly as told by your health care provider.  Lifestyle   · Limit alcohol intake to no more than 1 drink a day for nonpregnant women and 2 drinks a day for men. One drink equals 12 oz of beer, 5 oz of wine, or 1½ oz of hard liquor.  · Follow a set schedule for eating and sleeping.  · Eat a balanced diet that includes fresh fruits and vegetables, whole grains, low-fat dairy, and lean meat.  · Get 7-8 hours of sleep each night.  General instructions   · Take over-the-counter and prescription medicines only as told by your health care provider.  · Think about joining a support group. Your health care provider may be able to recommend a support group.  · Talk with your family and loved ones about your treatment goals and how they can help.  · Keep all follow-up visits as told by your health care provider. This is important.  Where to find more information:  For more information about bipolar disorder, visit the following websites:  · National Alliance on Mental Illness: www.nami.org  · U.[place]. National Institute of Mental Health: www.nimh.nih.gov    Contact a health care provider if:  · Your symptoms get worse.  · You have side effects from your medicine, and they get worse.  · You have trouble sleeping.  · You have trouble doing daily activities.  · You feel unsafe in your surroundings.  · You are dealing with substance abuse.  Get help right away if:  · You have new symptoms.  · You have thoughts about harming yourself.  · You self-harm.  This information is not intended to replace advice given to you by your health care provider. Make sure you discuss any questions you have with your health care provider.  Document Released: 12/16/2000 Document Revised: 05/05/2016 Document Reviewed: 05/09/2016  Elsevier Interactive Patient Education ©  2017 [employer]

## 2016-12-26 NOTE — Progress Notes (Signed)
Subjective:    Patient ID: Lindsay Dickerson, female    DOB: [birth year], 46 y.o.   MRN: [id number]  HPI  Patient comes in today c/o of worsening anxiety- she says one minute sheis fine and the next minute she is "ready to kill somebody". Has gotten worse since her son died last year. SHe is currently on effexor  daily. She has tried zolofy, paxil and prozac in the past without good results. Depression screen [facility] 2/9 08/06/2016 05/15/2015 10/05/2014 10/27/2013  Decreased Interest 0 Down, Depressed, Hopeless 0 PHQ - 2 Score 0 Altered sleeping - Tired, decreased energy - Change in appetite - Feeling bad or failure about yourself  - Trouble concentrating - Moving slowly or fidgety/restless - Suicidal thoughts - 0 0 0  PHQ-9 Score - Difficult doing work/chores - Very difficult - -   Has there ever been a period of time when you were not your usual self and ... - you felt so good or so hyper that other people thought you were not your normal self or you were so hyper that you got into trouble?  Yes - you were so irritable that you shouted at people or started fights or arguments? Yes - you felt much more self-confident than usual? Yes - you got much less sleep than usual and found that you didn't really miss it? Yes - you were more talkative or spoke much faster than usual? Yes - thoughts raced through your head or you couldn't slow your mind down? Yes - you were so easily distracted by things around you that you had trouble concentrating or stay on track? Yes -you had much more energy than usual? Yes - you were much more active or did many more things than usual? Yes - you were much more social or outgoing than usual, for example, you telephoned friends in the middle of the night? No -you were much more interested in sex than usual? Yes -you did things that were unusual for you or that other people might have  thought were excessive, foolish or risky? Yes -spending money got you or your family in trouble? Yes    If you checked yes for more than one of the above, have several of these ever happened during the same period of time? Yes  How much of a problem did any of these cause you- like being able to work; having family, money or legal troubles; getting in arguments or fights? serious  Have any of your blood relatives had manic-depressive illness or bipolar disorder?  No   Has a health professional ever told you that you have manic-depressive illness or bipolar disorder? No                                Review of Systems  Constitutional: Negative.   HENT: Negative.   Respiratory: Negative.   Cardiovascular: Negative.   Genitourinary: Negative.   Neurological: Negative.   Psychiatric/Behavioral: Positive for agitation, dysphoric mood and sleep disturbance. The patient is nervous/anxious.   All other systems reviewed and are negative.      Objective:   Physical Exam  Constitutional: She is oriented to person, place, and time. She appears well-developed  and well-nourished. No distress.  Cardiovascular: Normal rate and regular rhythm.   Pulmonary/Chest: Effort normal and breath sounds normal.  Neurological: She is alert and oriented to person, place, and time.  Skin: Skin is warm.  Psychiatric:  Tearful throughout exam   BP 119/77   Pulse 72   Temp (!) 96.7 F (35.9 C) (Oral)   Ht  (1.626 m)   Wt 133 lb (60.3 kg)   BMI 22.83 kg/m       Assessment & Plan:   1. 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 QUEtiapine (SEROQUEL) 100 MG tablet    Sig: Take 1 tablet (100 mg total) by mouth at bedtime.    Dispense:  30 tablet    Refill:  0    Order Specific Question:   Supervising Provider    Answer:   VINCENT, CAROL L [[id number]]  .  ALPRAZolam (XANAX) 0.25 MG tablet    Sig: Take 1 tablet (0.25 mg total) by mouth 2 (two) times daily as needed for anxiety.    Dispense:  40 tablet    Refill:  0    Order Specific Question:   Supervising Provider    Answer:   Ala Bent    Stress management Follow up in 3 weeks  Lindsay Daphine Deutscher, FNP

## 2016-12-27 ENCOUNTER — Telehealth: Payer: Self-pay | Admitting: Nurse Practitioner

## 2016-12-27 NOTE — Telephone Encounter (Signed)
Patient seen MMM 4/5 and states she is having bad headaches that she has been getting daily for the last 3 weeks. Patient would like Allergra D called into [facility] in [place]. States this normally helps with her headaches. Please advise and send back to the pharmacy.

## 2016-12-30 MED ORDER — FEXOFENADINE-PSEUDOEPHED ER 180-240 MG PO TB24: 1.0000 | ORAL_TABLET | Freq: Every day | ORAL | 1 refills | Status: DC

## 2016-12-31 ENCOUNTER — Other Ambulatory Visit: Payer: Self-pay | Admitting: Nurse Practitioner

## 2016-12-31 DIAGNOSIS — F32A Depression, unspecified: Secondary | ICD-10-CM

## 2016-12-31 DIAGNOSIS — I10 Essential (primary) hypertension: Secondary | ICD-10-CM

## 2016-12-31 DIAGNOSIS — E785 Hyperlipidemia, unspecified: Secondary | ICD-10-CM

## 2016-12-31 DIAGNOSIS — F329 Major depressive disorder, single episode, unspecified: Secondary | ICD-10-CM

## 2017-01-16 ENCOUNTER — Encounter: Payer: Self-pay | Admitting: Nurse Practitioner

## 2017-01-16 ENCOUNTER — Ambulatory Visit (INDEPENDENT_AMBULATORY_CARE_PROVIDER_SITE_OTHER): Payer: Medicaid Other | Admitting: Nurse Practitioner

## 2017-01-16 VITALS — BP 106/73 | HR 82 | Temp 97.7°F | Ht 64.0 in | Wt 134.0 lb

## 2017-01-16 DIAGNOSIS — F332 Major depressive disorder, recurrent severe without psychotic features: Secondary | ICD-10-CM | POA: Diagnosis not present

## 2017-01-16 DIAGNOSIS — L409 Psoriasis, unspecified: Secondary | ICD-10-CM | POA: Diagnosis not present

## 2017-01-16 DIAGNOSIS — F319 Bipolar disorder, unspecified: Secondary | ICD-10-CM

## 2017-01-16 DIAGNOSIS — I10 Essential (primary) hypertension: Secondary | ICD-10-CM | POA: Diagnosis not present

## 2017-01-16 DIAGNOSIS — E785 Hyperlipidemia, unspecified: Secondary | ICD-10-CM

## 2017-01-16 MED ORDER — QUETIAPINE FUMARATE ER 200 MG PO TB24
200.0000 mg | ORAL_TABLET | Freq: Every day | ORAL | 5 refills | Status: DC
Start: 2017-01-16 — End: 2017-04-21

## 2017-01-16 NOTE — Patient Instructions (Signed)
Stress and Stress Management Stress is a normal reaction to life events. It is what you feel when life demands more than you are used to or more than you can handle. Some stress can be useful. For example, the stress reaction can help you catch the last bus of the day, study for a test, or meet a deadline at work. But stress that occurs too often or for too long can cause problems. It can affect your emotional health and interfere with relationships and normal daily activities. Too much stress can weaken your immune system and increase your risk for physical illness. If you already have a medical problem, stress can make it worse. What are the causes? All sorts of life events may cause stress. An event that causes stress for one person may not be stressful for another person. Major life events commonly cause stress. These may be positive or negative. Examples include losing your job, moving into a new home, getting married, having a baby, or losing a loved one. Less obvious life events may also cause stress, especially if they occur day after day or in combination. Examples include working long hours, driving in traffic, caring for children, being in debt, or being in a difficult relationship. What are the signs or symptoms? Stress may cause emotional symptoms including, the following:  Anxiety. This is feeling worried, afraid, on edge, overwhelmed, or out of control.  Anger. This is feeling irritated or impatient.  Depression. This is feeling sad, down, helpless, or guilty.  Difficulty focusing, remembering, or making decisions. Stress may cause physical symptoms, including the following:  Aches and pains. These may affect your head, neck, back, stomach, or other areas of your body.  Tight muscles or clenched jaw.  Low energy or trouble sleeping. Stress may cause unhealthy behaviors, including the following:  Eating to feel better (overeating) or skipping meals.  Sleeping too little, too  much, or both.  Working too much or putting off tasks (procrastination).  Smoking, drinking alcohol, or using drugs to feel better. How is this diagnosed? Stress is diagnosed through an assessment by your health care provider. Your health care provider will ask questions about your symptoms and any stressful life events.Your health care provider will also ask about your medical history and may order blood tests or other tests. Certain medical conditions and medicine can cause physical symptoms similar to stress. Mental illness can cause emotional symptoms and unhealthy behaviors similar to stress. Your health care provider may refer you to a mental health professional for further evaluation. How is this treated? Stress management is the recommended treatment for stress.The goals of stress management are reducing stressful life events and coping with stress in healthy ways. Techniques for reducing stressful life events include the following:  Stress identification. Self-monitor for stress and identify what causes stress for you. These skills may help you to avoid some stressful events.  Time management. Set your priorities, keep a calendar of events, and learn to say "no." These tools can help you avoid making too many commitments. Techniques for coping with stress include the following:  Rethinking the problem. Try to think realistically about stressful events rather than ignoring them or overreacting. Try to find the positives in a stressful situation rather than focusing on the negatives.  Exercise. Physical exercise can release both physical and emotional tension. The key is to find a form of exercise you enjoy and do it regularly.  Relaxation techniques. These relax the body and mind. Examples include yoga,  meditation, tai chi, biofeedback, deep breathing, progressive muscle relaxation, listening to music, being out in nature, journaling, and other hobbies. Again, the key is to find one or  more that you enjoy and can do regularly.  Healthy lifestyle. Eat a balanced diet, get plenty of sleep, and do not smoke. Avoid using alcohol or drugs to relax.  Strong support network. Spend time with family, friends, or other people you enjoy being around.Express your feelings and talk things over with someone you trust. Counseling or talktherapy with a mental health professional may be helpful if you are having difficulty managing stress on your own. Medicine is typically not recommended for the treatment of stress.Talk to your health care provider if you think you need medicine for symptoms of stress. Follow these instructions at home:  Keep all follow-up visits as directed by your health care provider.  Take all medicines as directed by your health care provider. Contact a health care provider if:  Your symptoms get worse or you start having new symptoms.  You feel overwhelmed by your problems and can no longer manage them on your own. Get help right away if:  You feel like hurting yourself or someone else. This information is not intended to replace advice given to you by your health care provider. Make sure you discuss any questions you have with your health care provider. Document Released: 03/05/2001 Document Revised: 02/15/2016 Document Reviewed: 05/04/2013 Elsevier Interactive Patient Education  2017 [employer].

## 2017-01-16 NOTE — Progress Notes (Signed)
Subjective:    Patient ID: Lindsay Dickerson, female    DOB: 04-18-1971, 46 y.o.   MRN: [id number]  HPI   Lindsay Dickerson is here today for follow up of chronic medical problem.  Outpatient Encounter Prescriptions as of 01/16/2017  Medication Sig  . ALPRAZolam (XANAX) 0.25 MG tablet Take 1 tablet (0.25 mg total) by mouth 2 (two) times daily as needed for anxiety.  Marland Kitchen atorvastatin (LIPITOR) 40 MG tablet TAKE 1 TABLET (40 MG TOTAL) BY MOUTH DAILY AT 6 PM.  . fenofibrate (TRICOR) 145 MG tablet TAKE 1 TABLET (145 MG TOTAL) BY MOUTH DAILY.  . fexofenadine-pseudoephedrine (ALLEGRA-D ALLERGY & CONGESTION) 180-240 MG 24 hr tablet Take 1 tablet by mouth daily.  Marland Kitchen lisinopril-hydrochlorothiazide (PRINZIDE,ZESTORETIC) 20-12.5 MG tablet TAKE 1 TABLET BY MOUTH DAILY.  Marland Kitchen QUEtiapine (SEROQUEL) 100 MG tablet Take 1 tablet (100 mg total) by mouth at bedtime.  Marland Kitchen venlafaxine XR (EFFEXOR-XR) 75 MG 24 hr capsule TAKE ONE CAPSULE EVERY MORNING WITH BREAKFAST   No facility-administered encounter medications on file as of 01/16/2017.     1. Essential hypertension  No c/o chest pain,SOB or HA- does not check blood pressures at home  2. Psoriasis  Doing well- not spreading  3. Severe episode of recurrent major depressive disorder, without psychotic features ([place])  Patient was seen with anxiety- depression screen was 24, as well as positive mood disorder questionaire. SHe was put on seroquel along with her xanax and effexor. She says it has really helped here a lot- has made her brain slow down and she is able to think better. Controlling her temper much better. Depression screen [facility] 2/9 12/26/2016 08/06/2016 05/15/2015 10/05/2014 10/27/2013  Decreased Interest 3 0 3 2 3   Down, Depressed, Hopeless 3 0 3 1 3   PHQ - 2 Score 6 0 6 3 6   Altered sleeping 3 - 3 2 3   Tired, decreased energy 2 - 3 2 3   Change in appetite 3 - 3 1 3   Feeling bad or failure about yourself  1 - 3 1 2   Trouble concentrating 3 - 3 1 2     Moving slowly or fidgety/restless 0 - 3 2 2   Suicidal thoughts 1 - 0 0 0  PHQ-9 Score 19 - 24 12 21   Difficult doing work/chores Extremely dIfficult - Very difficult - -     4. Hyperlipidemia with target LDL less than 100  Does not watch diet at all.    New complaints: No new complaints    Review of Systems  Constitutional: Negative for diaphoresis.  Eyes: Negative for pain.  Respiratory: Negative for shortness of breath.   Cardiovascular: Negative for chest pain, palpitations and leg swelling.  Gastrointestinal: Negative for abdominal pain.  Endocrine: Negative for polydipsia.  Skin: Negative for rash.  Neurological: Negative for dizziness, weakness and headaches.  Hematological: Does not bruise/bleed easily.       Objective:   Physical Exam  Constitutional: She is oriented to person, place, and time. She appears well-developed and well-nourished.  HENT:  Nose: Nose normal.  Mouth/Throat: Oropharynx is clear and moist.  Eyes: EOM are normal.  Neck: Trachea normal, normal range of motion and full passive range of motion without pain. Neck supple. No JVD present. Carotid bruit is not present. No thyromegaly present.  Cardiovascular: Normal rate, regular rhythm, normal heart sounds and intact distal pulses.  Exam reveals no gallop and no friction rub.   No murmur heard. Pulmonary/Chest: Effort normal and breath sounds normal.  Abdominal: Soft. Bowel sounds are normal. She exhibits no distension and no mass. There is no tenderness.  Musculoskeletal: Normal range of motion.  Lymphadenopathy:    She has no cervical adenopathy.  Neurological: She is alert and oriented to person, place, and time. She has normal reflexes.  Skin: Skin is warm and dry.  Psychiatric: She has a normal mood and affect. Her behavior is normal. Judgment and thought content normal.   BP 106/73   Pulse 82   Temp 97.7 F (36.5 C) (Oral)   Ht 5' 4"  (1.626 m)   Wt 134 lb (60.8 kg)   BMI 23.00 kg/m         Assessment & Plan:  1. Essential hypertension Low sodium diet - CMP14+EGFR  2. Psoriasis No extremely  Hot showers  3. Severe episode of recurrent major depressive disorder, without psychotic features ([place]) Stress management  4. Hyperlipidemia with target LDL less than 100 Low fat diet - Lipid panel  5. Bipolar 1 disorder (HCC) Increase seroquel dose from 138m to 2068mdaily - QUEtiapine (SEROQUEL XR) 200 MG 24 hr tablet; Take 1 tablet (200 mg total) by mouth at bedtime.  Dispense: 30 tablet; Refill: 5    Labs pending Health maintenance reviewed Diet and exercise encouraged Continue all meds Follow up  In 3 months   MaNaguaboFNP

## 2017-01-17 LAB — CMP14+EGFR
ALBUMIN: 4.9 g/dL (ref 3.5–5.5)
ALT: 18 IU/L (ref 0–32)
AST: 25 IU/L (ref 0–40)
Albumin/Globulin Ratio: 1.6 (ref 1.2–2.2)
Alkaline Phosphatase: 42 IU/L (ref 39–117)
BILIRUBIN TOTAL: 0.5 mg/dL (ref 0.0–1.2)
BUN / CREAT RATIO: 14 (ref 9–23)
BUN: 12 mg/dL (ref 6–24)
CHLORIDE: 92 mmol/L — AB (ref 96–106)
CO2: 25 mmol/L (ref 18–29)
CREATININE: 0.84 mg/dL (ref 0.57–1.00)
Calcium: 9.6 mg/dL (ref 8.7–10.2)
GFR calc non Af Amer: 83 mL/min/{1.73_m2} (ref >59)
GFR, EST AFRICAN AMERICAN: 96 mL/min/{1.73_m2} (ref >59)
GLUCOSE: 91 mg/dL (ref 65–99)
Globulin, Total: 3.1 g/dL (ref 1.5–4.5)
Potassium: 4.5 mmol/L (ref 3.5–5.2)
Sodium: 134 mmol/L (ref 134–144)
TOTAL PROTEIN: 8 g/dL (ref 6.0–8.5)

## 2017-01-17 LAB — LIPID PANEL
CHOL/HDL RATIO: 30 ratio — AB (ref 0.0–4.4)
Cholesterol, Total: 180 mg/dL (ref 100–199)
HDL: 6 mg/dL — ABNORMAL LOW (ref >39)
LDL CALC: 112 mg/dL — AB (ref 0–99)
Triglycerides: 309 mg/dL — ABNORMAL HIGH (ref 0–149)
VLDL CHOLESTEROL CAL: 62 mg/dL — AB (ref 5–40)

## 2017-01-23 ENCOUNTER — Other Ambulatory Visit: Payer: Self-pay | Admitting: Nurse Practitioner

## 2017-01-23 DIAGNOSIS — E785 Hyperlipidemia, unspecified: Secondary | ICD-10-CM

## 2017-03-30 ENCOUNTER — Other Ambulatory Visit: Payer: Self-pay | Admitting: Nurse Practitioner

## 2017-03-30 DIAGNOSIS — E785 Hyperlipidemia, unspecified: Secondary | ICD-10-CM

## 2017-03-30 DIAGNOSIS — F329 Major depressive disorder, single episode, unspecified: Secondary | ICD-10-CM

## 2017-03-30 DIAGNOSIS — F32A Depression, unspecified: Secondary | ICD-10-CM

## 2017-04-21 ENCOUNTER — Encounter: Payer: Self-pay | Admitting: Nurse Practitioner

## 2017-04-21 ENCOUNTER — Ambulatory Visit (INDEPENDENT_AMBULATORY_CARE_PROVIDER_SITE_OTHER): Payer: Medicaid Other | Admitting: Nurse Practitioner

## 2017-04-21 VITALS — BP 106/70 | HR 81 | Temp 97.1°F | Ht 64.0 in | Wt 134.0 lb

## 2017-04-21 DIAGNOSIS — R739 Hyperglycemia, unspecified: Secondary | ICD-10-CM

## 2017-04-21 DIAGNOSIS — Z1231 Encounter for screening mammogram for malignant neoplasm of breast: Secondary | ICD-10-CM

## 2017-04-21 DIAGNOSIS — I10 Essential (primary) hypertension: Secondary | ICD-10-CM | POA: Diagnosis not present

## 2017-04-21 DIAGNOSIS — F332 Major depressive disorder, recurrent severe without psychotic features: Secondary | ICD-10-CM | POA: Diagnosis not present

## 2017-04-21 DIAGNOSIS — F319 Bipolar disorder, unspecified: Secondary | ICD-10-CM | POA: Diagnosis not present

## 2017-04-21 DIAGNOSIS — F3341 Major depressive disorder, recurrent, in partial remission: Secondary | ICD-10-CM | POA: Diagnosis not present

## 2017-04-21 DIAGNOSIS — E785 Hyperlipidemia, unspecified: Secondary | ICD-10-CM | POA: Diagnosis not present

## 2017-04-21 MED ORDER — QUETIAPINE FUMARATE ER 200 MG PO TB24: 200.0000 mg | ORAL_TABLET | Freq: Every day | ORAL | 5 refills | Status: DC

## 2017-04-21 MED ORDER — VENLAFAXINE HCL ER 75 MG PO CP24: ORAL_CAPSULE | ORAL | 5 refills | Status: DC

## 2017-04-21 MED ORDER — FENOFIBRATE 145 MG PO TABS: 145.0000 mg | ORAL_TABLET | Freq: Every day | ORAL | 5 refills | Status: DC

## 2017-04-21 MED ORDER — LISINOPRIL-HYDROCHLOROTHIAZIDE 20-12.5 MG PO TABS: 1.0000 | ORAL_TABLET | Freq: Every day | ORAL | 5 refills | Status: DC

## 2017-04-21 MED ORDER — ATORVASTATIN CALCIUM 40 MG PO TABS: 40.0000 mg | ORAL_TABLET | Freq: Every day | ORAL | 0 refills | Status: DC

## 2017-04-21 MED ORDER — ALPRAZOLAM 0.25 MG PO TABS: 0.2500 mg | ORAL_TABLET | Freq: Two times a day (BID) | ORAL | 0 refills | Status: DC | PRN

## 2017-04-21 NOTE — Patient Instructions (Signed)
Stress and Stress Management Stress is a normal reaction to life events. It is what you feel when life demands more than you are used to or more than you can handle. Some stress can be useful. For example, the stress reaction can help you catch the last bus of the day, study for a test, or meet a deadline at work. But stress that occurs too often or for too long can cause problems. It can affect your emotional health and interfere with relationships and normal daily activities. Too much stress can weaken your immune system and increase your risk for physical illness. If you already have a medical problem, stress can make it worse. What are the causes? All sorts of life events may cause stress. An event that causes stress for one person may not be stressful for another person. Major life events commonly cause stress. These may be positive or negative. Examples include losing your job, moving into a new home, getting married, having a baby, or losing a loved one. Less obvious life events may also cause stress, especially if they occur day after day or in combination. Examples include working long hours, driving in traffic, caring for children, being in debt, or being in a difficult relationship. What are the signs or symptoms? Stress may cause emotional symptoms including, the following:  Anxiety. This is feeling worried, afraid, on edge, overwhelmed, or out of control.  Anger. This is feeling irritated or impatient.  Depression. This is feeling sad, down, helpless, or guilty.  Difficulty focusing, remembering, or making decisions.  Stress may cause physical symptoms, including the following:  Aches and pains. These may affect your head, neck, back, stomach, or other areas of your body.  Tight muscles or clenched jaw.  Low energy or trouble sleeping.  Stress may cause unhealthy behaviors, including the following:  Eating to feel better (overeating) or skipping meals.  Sleeping too little,  too much, or both.  Working too much or putting off tasks (procrastination).  Smoking, drinking alcohol, or using drugs to feel better.  How is this diagnosed? Stress is diagnosed through an assessment by your health care provider. Your health care provider will ask questions about your symptoms and any stressful life events.Your health care provider will also ask about your medical history and may order blood tests or other tests. Certain medical conditions and medicine can cause physical symptoms similar to stress. Mental illness can cause emotional symptoms and unhealthy behaviors similar to stress. Your health care provider may refer you to a mental health professional for further evaluation. How is this treated? Stress management is the recommended treatment for stress.The goals of stress management are reducing stressful life events and coping with stress in healthy ways. Techniques for reducing stressful life events include the following:  Stress identification. Self-monitor for stress and identify what causes stress for you. These skills may help you to avoid some stressful events.  Time management. Set your priorities, keep a calendar of events, and learn to say "no." These tools can help you avoid making too many commitments.  Techniques for coping with stress include the following:  Rethinking the problem. Try to think realistically about stressful events rather than ignoring them or overreacting. Try to find the positives in a stressful situation rather than focusing on the negatives.  Exercise. Physical exercise can release both physical and emotional tension. The key is to find a form of exercise you enjoy and do it regularly.  Relaxation techniques. These relax the body and  mind. Examples include yoga, meditation, tai chi, biofeedback, deep breathing, progressive muscle relaxation, listening to music, being out in nature, journaling, and other hobbies. Again, the key is to find  one or more that you enjoy and can do regularly.  Healthy lifestyle. Eat a balanced diet, get plenty of sleep, and do not smoke. Avoid using alcohol or drugs to relax.  Strong support network. Spend time with family, friends, or other people you enjoy being around.Express your feelings and talk things over with someone you trust.  Counseling or talktherapy with a mental health professional may be helpful if you are having difficulty managing stress on your own. Medicine is typically not recommended for the treatment of stress.Talk to your health care provider if you think you need medicine for symptoms of stress. Follow these instructions at home:  Keep all follow-up visits as directed by your health care provider.  Take all medicines as directed by your health care provider. Contact a health care provider if:  Your symptoms get worse or you start having new symptoms.  You feel overwhelmed by your problems and can no longer manage them on your own. Get help right away if:  You feel like hurting yourself or someone else. This information is not intended to replace advice given to you by your health care provider. Make sure you discuss any questions you have with your health care provider. Document Released: 03/05/2001 Document Revised: 02/15/2016 Document Reviewed: 05/04/2013 Elsevier Interactive Patient Education  2017 [employer] Inc[employer]  

## 2017-04-21 NOTE — Progress Notes (Signed)
Subjective:    Patient ID: Lindsay Dickerson, female    DOB: [birth year], 46 y.o.   MRN: [id number]  HPI  Lindsay Dickerson is here today for follow up of chronic medical problem.  Outpatient Encounter Prescriptions as of 04/21/2017  Medication Sig  . ALPRAZolam (XANAX) 0.25 MG tablet Take 1 tablet (0.25 mg total) by mouth 2 (two) times daily as needed for anxiety.  Marland Kitchen atorvastatin (LIPITOR) 40 MG tablet TAKE 1 TABLET (40 MG TOTAL) BY MOUTH DAILY AT 6 PM.  . fenofibrate (TRICOR) 145 MG tablet TAKE 1 TABLET (145 MG TOTAL) BY MOUTH DAILY.  . fexofenadine-pseudoephedrine (ALLEGRA-D ALLERGY & CONGESTION) 180-240 MG 24 hr tablet Take 1 tablet by mouth daily.  Marland Kitchen lisinopril-hydrochlorothiazide (PRINZIDE,ZESTORETIC) 20-12.5 MG tablet TAKE 1 TABLET BY MOUTH DAILY.  Marland Kitchen QUEtiapine (SEROQUEL XR) 200 MG 24 hr tablet Take 1 tablet (200 mg total) by mouth at bedtime.  Marland Kitchen venlafaxine XR (EFFEXOR-XR) 75 MG 24 hr capsule TAKE ONE CAPSULE EVERY MORNING WITH BREAKFAST   No facility-administered encounter medications on file as of 04/21/2017.     1. Essential hypertension  No c/o chest pain, SOB or headache. Does not check blood pressure at home  2. Hyperlipidemia with target LDL less than 100  Does not watch diet at home- just eats whatever she wants to  3. Severe episode of recurrent major depressive disorder, without psychotic features ([place])   she is currently on seroquel, effexor and xanx. She is doing okay. Her son overdosed on heroin about 1 year ago and she is still having a hard time with this. SHe denies being suicidal herself. Depression screen [facility] 2/9 04/21/2017 12/26/2016 08/06/2016 05/15/2015 10/05/2014  Decreased Interest 2 3 0 3 2  Down, Depressed, Hopeless 1 3 0 3 1  PHQ - 2 Score 3 6 0 6 3  Altered sleeping 2 3 - 3 2  Tired, decreased energy 2 2 - 3 2  Change in appetite 1 3 - 3 1  Feeling bad or failure about yourself  1 1 - 3 1  Trouble concentrating 1 3 - 3 1  Moving slowly or  fidgety/restless 1 0 - 3 2  Suicidal thoughts 0 1 - 0 0  PHQ-9 Score 11 19 - 24 12  Difficult doing work/chores Somewhat difficult Extremely dIfficult - Very difficult -        New complaints: none  Social history: Her daughter is in high school and is currenlty living with patients mom. They get along but do better if they do not live in the same house.   Review of Systems  Constitutional: Negative for activity change and appetite change.  HENT: Negative.   Eyes: Negative for pain.  Respiratory: Negative for shortness of breath.   Cardiovascular: Negative for chest pain, palpitations and leg swelling.  Gastrointestinal: Negative for abdominal pain.  Endocrine: Negative for polydipsia.  Genitourinary: Negative.   Skin: Negative for rash.  Neurological: Negative for dizziness, weakness and headaches.  Hematological: Does not bruise/bleed easily.  Psychiatric/Behavioral: Negative.   All other systems reviewed and are negative.      Objective:   Physical Exam  Constitutional: She is oriented to person, place, and time. She appears well-developed and well-nourished.  HENT:  Nose: Nose normal.  Mouth/Throat: Oropharynx is clear and moist.  Eyes: EOM are normal.  Neck: Trachea normal, normal range of motion and full passive range of motion without pain. Neck supple. No JVD present. Carotid bruit is not present. No  thyromegaly present.  Cardiovascular: Normal rate, regular rhythm, normal heart sounds and intact distal pulses.  Exam reveals no gallop and no friction rub.   No murmur heard. Pulmonary/Chest: Effort normal and breath sounds normal.  Abdominal: Soft. Bowel sounds are normal. She exhibits no distension and no mass. There is no tenderness.  Musculoskeletal: Normal range of motion.  Lymphadenopathy:    She has no cervical adenopathy.  Neurological: She is alert and oriented to person, place, and time. She has normal reflexes.  Skin: Skin is warm and dry.    Psychiatric: She has a normal mood and affect. Her behavior is normal. Judgment and thought content normal.    BP 106/70   Pulse 81   Temp (!) 97.1 F (36.2 C) (Oral)   Ht _0  (1.626 m)   Wt 134 lb (60.8 kg)   BMI 23.00 kg/m        Assessment & Plan:  1. Essential hypertension Low sodium diet - lisinopril-hydrochlorothiazide (PRINZIDE,ZESTORETIC) 20-12.5 MG tablet; Take 1 tablet by mouth daily.  Dispense: 30 tablet; Refill: 5 - CMP14+EGFR  2. Hyperlipidemia with target LDL less than 100 Low fat diet - fenofibrate (TRICOR) 145 MG tablet; Take 1 tablet (145 mg total) by mouth daily.  Dispense: 30 tablet; Refill: 5 - atorvastatin (LIPITOR) 40 MG tablet; Take 1 tablet (40 mg total) by mouth daily at 6 PM.  Dispense: 30 tablet; Refill: 0 - Lipid panel  3. Severe episode of recurrent major depressive disorder, without psychotic features ([place]) Stress management - venlafaxine XR (EFFEXOR-XR) 75 MG 24 hr capsule; TAKE ONE CAPSULE EVERY MORNING WITH BREAKFAST  Dispense: 30 capsule; Refill: 5   4. Bipolar 1 disorder ([place]) Stress management Need to get out of house and be more active - ALPRAZolam (XANAX) 0.25 MG tablet; Take 1 tablet (0.25 mg total) by mouth 2 (two) times daily as needed for anxiety.  Dispense: 40 tablet; Refill: 0 - QUEtiapine (SEROQUEL XR) 200 MG 24 hr tablet; Take 1 tablet (200 mg total) by mouth at bedtime.  Dispense: 30 tablet; Refill: 5    Labs pending Health maintenance reviewed Diet and exercise encouraged Continue all meds Follow up  In 3 months   [place], FNP

## 2017-04-21 NOTE — Addendum Note (Signed)
Addended by: Bennie PieriniMARTIN, MARY-MARGARET on: 04/21/2017 10:37 AM   Modules accepted: Orders

## 2017-04-22 ENCOUNTER — Other Ambulatory Visit: Payer: Self-pay | Admitting: Nurse Practitioner

## 2017-04-22 DIAGNOSIS — R739 Hyperglycemia, unspecified: Secondary | ICD-10-CM

## 2017-04-22 LAB — LIPID PANEL
CHOLESTEROL TOTAL: 130 mg/dL (ref 100–199)
Chol/HDL Ratio: 18.6 ratio — ABNORMAL HIGH (ref 0.0–4.4)
HDL: 7 mg/dL — AB (ref >39)
LDL Calculated: 77 mg/dL (ref 0–99)
Triglycerides: 231 mg/dL — ABNORMAL HIGH (ref 0–149)
VLDL Cholesterol Cal: 46 mg/dL — ABNORMAL HIGH (ref 5–40)

## 2017-04-22 LAB — CMP14+EGFR
A/G RATIO: 1.8 (ref 1.2–2.2)
ALK PHOS: 37 IU/L — AB (ref 39–117)
ALT: 16 IU/L (ref 0–32)
AST: 24 IU/L (ref 0–40)
Albumin: 4.9 g/dL (ref 3.5–5.5)
BILIRUBIN TOTAL: 0.5 mg/dL (ref 0.0–1.2)
BUN/Creatinine Ratio: 20 (ref 9–23)
BUN: 16 mg/dL (ref 6–24)
CHLORIDE: 95 mmol/L — AB (ref 96–106)
CO2: 22 mmol/L (ref 20–29)
Calcium: 9.8 mg/dL (ref 8.7–10.2)
Creatinine, Ser: 0.82 mg/dL (ref 0.57–1.00)
GFR calc non Af Amer: 85 mL/min/{1.73_m2} (ref >59)
GFR, EST AFRICAN AMERICAN: 98 mL/min/{1.73_m2} (ref >59)
Globulin, Total: 2.7 g/dL (ref 1.5–4.5)
Glucose: 133 mg/dL — ABNORMAL HIGH (ref 65–99)
POTASSIUM: 4.2 mmol/L (ref 3.5–5.2)
Sodium: 134 mmol/L (ref 134–144)
TOTAL PROTEIN: 7.6 g/dL (ref 6.0–8.5)

## 2017-04-22 LAB — BAYER DCA HB A1C WAIVED: HB A1C: 5.4 % (ref <7.0)

## 2017-04-22 NOTE — Addendum Note (Signed)
Addended by: Nefi Musich M on: 04/22/2017 01:51 PM   Modules accepted: Orders  

## 2017-04-25 ENCOUNTER — Other Ambulatory Visit: Payer: Self-pay | Admitting: Nurse Practitioner

## 2017-04-25 DIAGNOSIS — Z1231 Encounter for screening mammogram for malignant neoplasm of breast: Secondary | ICD-10-CM

## 2017-05-06 ENCOUNTER — Ambulatory Visit: Payer: Medicaid Other

## 2017-05-27 ENCOUNTER — Other Ambulatory Visit: Payer: Self-pay | Admitting: Nurse Practitioner

## 2017-05-28 ENCOUNTER — Other Ambulatory Visit: Payer: Self-pay | Admitting: Nurse Practitioner

## 2017-05-28 DIAGNOSIS — E785 Hyperlipidemia, unspecified: Secondary | ICD-10-CM

## 2017-07-25 ENCOUNTER — Encounter: Payer: Self-pay | Admitting: Nurse Practitioner

## 2017-07-25 ENCOUNTER — Ambulatory Visit (INDEPENDENT_AMBULATORY_CARE_PROVIDER_SITE_OTHER): Payer: Medicaid Other | Admitting: Nurse Practitioner

## 2017-07-25 VITALS — BP 102/68 | HR 84 | Temp 97.0°F | Ht 64.0 in | Wt 132.0 lb

## 2017-07-25 DIAGNOSIS — B9689 Other specified bacterial agents as the cause of diseases classified elsewhere: Secondary | ICD-10-CM | POA: Diagnosis not present

## 2017-07-25 DIAGNOSIS — F332 Major depressive disorder, recurrent severe without psychotic features: Secondary | ICD-10-CM

## 2017-07-25 DIAGNOSIS — I10 Essential (primary) hypertension: Secondary | ICD-10-CM

## 2017-07-25 DIAGNOSIS — F319 Bipolar disorder, unspecified: Secondary | ICD-10-CM | POA: Diagnosis not present

## 2017-07-25 DIAGNOSIS — E785 Hyperlipidemia, unspecified: Secondary | ICD-10-CM | POA: Diagnosis not present

## 2017-07-25 DIAGNOSIS — F3341 Major depressive disorder, recurrent, in partial remission: Secondary | ICD-10-CM | POA: Diagnosis not present

## 2017-07-25 DIAGNOSIS — Z23 Encounter for immunization: Secondary | ICD-10-CM

## 2017-07-25 DIAGNOSIS — N760 Acute vaginitis: Secondary | ICD-10-CM

## 2017-07-25 MED ORDER — VENLAFAXINE HCL ER 75 MG PO CP24: ORAL_CAPSULE | ORAL | 5 refills | Status: DC

## 2017-07-25 MED ORDER — QUETIAPINE FUMARATE ER 200 MG PO TB24: 200.0000 mg | ORAL_TABLET | Freq: Every day | ORAL | 5 refills | Status: DC

## 2017-07-25 MED ORDER — ALPRAZOLAM 0.25 MG PO TABS: 0.2500 mg | ORAL_TABLET | Freq: Two times a day (BID) | ORAL | 1 refills | Status: DC | PRN

## 2017-07-25 MED ORDER — LISINOPRIL-HYDROCHLOROTHIAZIDE 20-12.5 MG PO TABS: 1.0000 | ORAL_TABLET | Freq: Every day | ORAL | 5 refills | Status: DC

## 2017-07-25 MED ORDER — METRONIDAZOLE 0.75 % VA GEL: Freq: Every day | VAGINAL | 0 refills | Status: DC

## 2017-07-25 MED ORDER — ATORVASTATIN CALCIUM 40 MG PO TABS: 40.0000 mg | ORAL_TABLET | Freq: Every day | ORAL | 0 refills | Status: DC

## 2017-07-25 MED ORDER — FENOFIBRATE 145 MG PO TABS: 145.0000 mg | ORAL_TABLET | Freq: Every day | ORAL | 5 refills | Status: DC

## 2017-07-25 NOTE — Patient Instructions (Signed)
Stress and Stress Management Stress is a normal reaction to life events. It is what you feel when life demands more than you are used to or more than you can handle. Some stress can be useful. For example, the stress reaction can help you catch the last bus of the day, study for a test, or meet a deadline at work. But stress that occurs too often or for too long can cause problems. It can affect your emotional health and interfere with relationships and normal daily activities. Too much stress can weaken your immune system and increase your risk for physical illness. If you already have a medical problem, stress can make it worse. What are the causes? All sorts of life events may cause stress. An event that causes stress for one person may not be stressful for another person. Major life events commonly cause stress. These may be positive or negative. Examples include losing your job, moving into a new home, getting married, having a baby, or losing a loved one. Less obvious life events may also cause stress, especially if they occur day after day or in combination. Examples include working long hours, driving in traffic, caring for children, being in debt, or being in a difficult relationship. What are the signs or symptoms? Stress may cause emotional symptoms including, the following:  Anxiety. This is feeling worried, afraid, on edge, overwhelmed, or out of control.  Anger. This is feeling irritated or impatient.  Depression. This is feeling sad, down, helpless, or guilty.  Difficulty focusing, remembering, or making decisions.  Stress may cause physical symptoms, including the following:  Aches and pains. These may affect your head, neck, back, stomach, or other areas of your body.  Tight muscles or clenched jaw.  Low energy or trouble sleeping.  Stress may cause unhealthy behaviors, including the following:  Eating to feel better (overeating) or skipping meals.  Sleeping too little,  too much, or both.  Working too much or putting off tasks (procrastination).  Smoking, drinking alcohol, or using drugs to feel better.  How is this diagnosed? Stress is diagnosed through an assessment by your health care provider. Your health care provider will ask questions about your symptoms and any stressful life events.Your health care provider will also ask about your medical history and may order blood tests or other tests. Certain medical conditions and medicine can cause physical symptoms similar to stress. Mental illness can cause emotional symptoms and unhealthy behaviors similar to stress. Your health care provider may refer you to a mental health professional for further evaluation. How is this treated? Stress management is the recommended treatment for stress.The goals of stress management are reducing stressful life events and coping with stress in healthy ways. Techniques for reducing stressful life events include the following:  Stress identification. Self-monitor for stress and identify what causes stress for you. These skills may help you to avoid some stressful events.  Time management. Set your priorities, keep a calendar of events, and learn to say "no." These tools can help you avoid making too many commitments.  Techniques for coping with stress include the following:  Rethinking the problem. Try to think realistically about stressful events rather than ignoring them or overreacting. Try to find the positives in a stressful situation rather than focusing on the negatives.  Exercise. Physical exercise can release both physical and emotional tension. The key is to find a form of exercise you enjoy and do it regularly.  Relaxation techniques. These relax the body and  mind. Examples include yoga, meditation, tai chi, biofeedback, deep breathing, progressive muscle relaxation, listening to music, being out in nature, journaling, and other hobbies. Again, the key is to find  one or more that you enjoy and can do regularly.  Healthy lifestyle. Eat a balanced diet, get plenty of sleep, and do not smoke. Avoid using alcohol or drugs to relax.  Strong support network. Spend time with family, friends, or other people you enjoy being around.Express your feelings and talk things over with someone you trust.  Counseling or talktherapy with a mental health professional may be helpful if you are having difficulty managing stress on your own. Medicine is typically not recommended for the treatment of stress.Talk to your health care provider if you think you need medicine for symptoms of stress. Follow these instructions at home:  Keep all follow-up visits as directed by your health care provider.  Take all medicines as directed by your health care provider. Contact a health care provider if:  Your symptoms get worse or you start having new symptoms.  You feel overwhelmed by your problems and can no longer manage them on your own. Get help right away if:  You feel like hurting yourself or someone else. This information is not intended to replace advice given to you by your health care provider. Make sure you discuss any questions you have with your health care provider. Document Released: 03/05/2001 Document Revised: 02/15/2016 Document Reviewed: 05/04/2013 Elsevier Interactive Patient Education  2017 [employer] Inc[employer]  

## 2017-07-25 NOTE — Progress Notes (Signed)
 Subjective:    Patient ID: Lindsay Dickerson, female    DOB: 04/27/1971, 46 y.o.   MRN: [id number]  HPI  Lindsay Dickerson is here today for follow up of chronic medical problem.  Outpatient Encounter Prescriptions as of 07/25/2017  Medication Sig  . ALPRAZolam (XANAX) 0.25 MG tablet Take 1 tablet (0.25 mg total) by mouth 2 (two) times daily as needed for anxiety.  . atorvastatin (LIPITOR) 40 MG tablet TAKE 1 TABLET (40 MG TOTAL) BY MOUTH DAILY AT 6 PM.  . fenofibrate (TRICOR) 145 MG tablet Take 1 tablet (145 mg total) by mouth daily.  . lisinopril-hydrochlorothiazide (PRINZIDE,ZESTORETIC) 20-12.5 MG tablet Take 1 tablet by mouth daily.  . metroNIDAZOLE (METROGEL) 0.75 % vaginal gel PLACE VAGINALLY AT BEDTIME.  . QUEtiapine (SEROQUEL XR) 200 MG 24 hr tablet Take 1 tablet (200 mg total) by mouth at bedtime.  . venlafaxine XR (EFFEXOR-XR) 75 MG 24 hr capsule TAKE ONE CAPSULE EVERY MORNING WITH BREAKFAST   No facility-administered encounter medications on file as of 07/25/2017.     1. Essential hypertension  No c/o chest pain, sob or headache. She does not check blood pressures at home. BP Readings from Last 3 Encounters:  04/21/17 106/70  01/16/17 106/73  12/26/16 119/77     2. Hyperlipidemia with target LDL less than 100  Does not watch diet  3. 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 She has occasional depressive episodes if she starts thinking about her son who overdosed. Over all doing well. Depression screen PHQ 2/9 07/25/2017 04/21/2017 12/26/2016  Decreased Interest 2 2 3  Down, Depressed, Hopeless 1 1 3  PHQ - 2 Score 3 3 6  Altered sleeping 2 2 3  Tired, decreased energy 2 2 2  Change in appetite 1 1 3  Feeling bad or failure about yourself  1 1 1  Trouble concentrating 1 1 3  Moving slowly or fidgety/restless 0 1 0  Suicidal thoughts 0 0 1    PHQ-9 Score 10 11 19  Difficult doing work/chores - Somewhat difficult Extremely dIfficult        New complaints: None today  Social history: Lives with boyfriend - daughter lives with patients mom    Review of Systems  Constitutional: Negative for activity change and appetite change.  HENT: Negative.   Eyes: Negative for pain.  Respiratory: Negative for shortness of breath.   Cardiovascular: Negative for chest pain, palpitations and leg swelling.  Gastrointestinal: Negative for abdominal pain.  Endocrine: Negative for polydipsia.  Genitourinary: Negative.   Skin: Negative for rash.  Neurological: Negative for dizziness, weakness and headaches.  Hematological: Does not bruise/bleed easily.  Psychiatric/Behavioral: Negative.   All other systems reviewed and are negative.      Objective:   Physical Exam  Constitutional: She is oriented to person, place, and time. She appears well-developed and well-nourished.  HENT:  Nose: Nose normal.  Mouth/Throat: Oropharynx is clear and moist.  Eyes: EOM are normal.  Neck: Trachea normal, normal range of motion and full passive range of motion without pain. Neck supple. No JVD present. Carotid bruit is not present. No thyromegaly present.  Cardiovascular: Normal rate, regular rhythm, normal heart sounds and intact distal pulses.  Exam reveals no gallop and no friction rub.   No murmur heard. Pulmonary/Chest: Effort normal and breath sounds normal.  Abdominal: Soft. Bowel sounds are normal. She exhibits   no distension and no mass. There is no tenderness.  Musculoskeletal: Normal range of motion.  Lymphadenopathy:    She has no cervical adenopathy.  Neurological: She is alert and oriented to person, place, and time. She has normal reflexes.  Skin: Skin is warm and dry.  Psychiatric: She has a normal mood and affect. Her behavior is normal. Judgment and thought content normal.    BP 102/68   Pulse 84   Temp (!) 97 F (36.1 C)  (Oral)   Ht 5' 4" (1.626 m)   Wt 132 lb (59.9 kg)   BMI 22.66 kg/m       Assessment & Plan:  1. Essential hypertension Low sodium diet - lisinopril-hydrochlorothiazide (PRINZIDE,ZESTORETIC) 20-12.5 MG tablet; Take 1 tablet by mouth daily.  Dispense: 30 tablet; Refill: 5 - CMP14+EGFR  2. Hyperlipidemia with target LDL less than 100 Low fat diet encouraged - fenofibrate (TRICOR) 145 MG tablet; Take 1 tablet (145 mg total) by mouth daily.  Dispense: 30 tablet; Refill: 5 - atorvastatin (LIPITOR) 40 MG tablet; Take 1 tablet (40 mg total) by mouth daily at 6 PM.  Dispense: 90 tablet; Refill: 0 - Lipid panel  3. Severe episode of recurrent major depressive disorder, without psychotic features ([place]) Continue effexor and seroquel Stress management  4. Bipolar 1 disorder (HCC) - QUEtiapine (SEROQUEL XR) 200 MG 24 hr tablet; Take 1 tablet (200 mg total) by mouth at bedtime.  Dispense: 30 tablet; Refill: 5 - ALPRAZolam (XANAX) 0.25 MG tablet; Take 1 tablet (0.25 mg total) by mouth 2 (two) times daily as needed for anxiety.  Dispense: 40 tablet; Refill: 1  5. Recurrent major depressive disorder, in partial remission (HCC) - venlafaxine XR (EFFEXOR-XR) 75 MG 24 hr capsule; TAKE ONE CAPSULE EVERY MORNING WITH BREAKFAST  Dispense: 30 capsule; Refill: 5  6. Bacterial vaginosis chronic - metroNIDAZOLE (METROGEL) 0.75 % vaginal gel; Place vaginally at bedtime.  Dispense: 70 g; Refill: 0    Labs pending Health maintenance reviewed Diet and exercise encouraged Continue all meds Follow up  In 6 months   [place], FNP

## 2017-07-26 LAB — CMP14+EGFR
ALBUMIN: 5.1 g/dL (ref 3.5–5.5)
ALK PHOS: 44 IU/L (ref 39–117)
ALT: 13 IU/L (ref 0–32)
AST: 27 IU/L (ref 0–40)
Albumin/Globulin Ratio: 1.8 (ref 1.2–2.2)
BUN / CREAT RATIO: 18 (ref 9–23)
BUN: 16 mg/dL (ref 6–24)
Bilirubin Total: 0.4 mg/dL (ref 0.0–1.2)
CALCIUM: 9.8 mg/dL (ref 8.7–10.2)
CO2: 24 mmol/L (ref 20–29)
CREATININE: 0.87 mg/dL (ref 0.57–1.00)
Chloride: 96 mmol/L (ref 96–106)
GFR calc Af Amer: 91 mL/min/{1.73_m2} (ref >59)
GFR, EST NON AFRICAN AMERICAN: 79 mL/min/{1.73_m2} (ref >59)
GLOBULIN, TOTAL: 2.9 g/dL (ref 1.5–4.5)
Glucose: 102 mg/dL — ABNORMAL HIGH (ref 65–99)
Potassium: 4.3 mmol/L (ref 3.5–5.2)
SODIUM: 134 mmol/L (ref 134–144)
Total Protein: 8 g/dL (ref 6.0–8.5)

## 2017-07-26 LAB — LIPID PANEL
CHOL/HDL RATIO: 26.3 ratio — AB (ref 0.0–4.4)
Cholesterol, Total: 158 mg/dL (ref 100–199)
HDL: 6 mg/dL — ABNORMAL LOW (ref >39)
LDL CALC: 82 mg/dL (ref 0–99)
Triglycerides: 349 mg/dL — ABNORMAL HIGH (ref 0–149)
VLDL Cholesterol Cal: 70 mg/dL — ABNORMAL HIGH (ref 5–40)

## 2017-10-02 ENCOUNTER — Other Ambulatory Visit: Payer: Self-pay

## 2017-10-02 NOTE — Telephone Encounter (Signed)
Medicaid denied Venlafaxine due to patient only having Family [employer] Medicaid

## 2017-10-07 ENCOUNTER — Telehealth: Payer: Self-pay | Admitting: Nurse Practitioner

## 2017-10-07 DIAGNOSIS — F319 Bipolar disorder, unspecified: Secondary | ICD-10-CM

## 2017-10-08 NOTE — Telephone Encounter (Signed)
Patient states she talked with MMM yesterday in the office about her medication. Patient checked with [facility] in [place]. Fenofibrate is $162 so she would like that changed to something else.  If the Quetiapine is changed to regular and not ER it goes from $400 to $9.  [facility] Please advise

## 2017-10-09 MED ORDER — QUETIAPINE FUMARATE 200 MG PO TABS: 200.0000 mg | ORAL_TABLET | Freq: Every day | ORAL | 5 refills | Status: DC

## 2017-10-09 NOTE — Telephone Encounter (Signed)
Pt aware.

## 2017-10-09 NOTE — Telephone Encounter (Signed)
There is no cheap fenofibrate- new seraquel sent to pharmacy

## 2017-11-01 ENCOUNTER — Telehealth: Payer: Self-pay | Admitting: Nurse Practitioner

## 2017-11-01 DIAGNOSIS — F3341 Major depressive disorder, recurrent, in partial remission: Secondary | ICD-10-CM

## 2017-11-01 MED ORDER — VENLAFAXINE HCL ER 75 MG PO CP24: ORAL_CAPSULE | ORAL | 2 refills | Status: DC

## 2017-11-01 NOTE — Telephone Encounter (Signed)
Is this okay to do? Please advise 

## 2017-11-01 NOTE — Telephone Encounter (Signed)
Sent to correct pharmacy

## 2017-11-03 ENCOUNTER — Other Ambulatory Visit: Payer: Self-pay

## 2017-11-03 MED ORDER — QUETIAPINE FUMARATE 200 MG PO TABS: 200.0000 mg | ORAL_TABLET | Freq: Every day | ORAL | 5 refills | Status: DC

## 2017-11-03 NOTE — Telephone Encounter (Signed)
Full voice mail.  Scripts were sent to pharmacy.

## 2017-11-03 NOTE — Telephone Encounter (Signed)
rx sent to pharmacy

## 2017-11-03 NOTE — Telephone Encounter (Signed)
Please advise if ok to change

## 2017-12-08 ENCOUNTER — Telehealth: Payer: Self-pay | Admitting: *Deleted

## 2017-12-08 DIAGNOSIS — E785 Hyperlipidemia, unspecified: Secondary | ICD-10-CM

## 2017-12-08 MED ORDER — ATORVASTATIN CALCIUM 40 MG PO TABS: 40.0000 mg | ORAL_TABLET | Freq: Every day | ORAL | 0 refills | Status: DC

## 2017-12-08 NOTE — Telephone Encounter (Signed)
Refill request sent to pharmacy.

## 2018-02-05 ENCOUNTER — Other Ambulatory Visit: Payer: Self-pay | Admitting: Pediatrics

## 2018-02-05 DIAGNOSIS — F3341 Major depressive disorder, recurrent, in partial remission: Secondary | ICD-10-CM

## 2018-03-11 ENCOUNTER — Other Ambulatory Visit: Payer: Self-pay | Admitting: Nurse Practitioner

## 2018-03-11 DIAGNOSIS — E785 Hyperlipidemia, unspecified: Secondary | ICD-10-CM

## 2018-03-12 NOTE — Telephone Encounter (Signed)
Last refill without being seen 

## 2018-03-12 NOTE — Telephone Encounter (Signed)
Last lipid 07/25/17

## 2018-05-14 ENCOUNTER — Other Ambulatory Visit: Payer: Self-pay | Admitting: Nurse Practitioner

## 2018-05-14 DIAGNOSIS — I10 Essential (primary) hypertension: Secondary | ICD-10-CM

## 2018-05-29 ENCOUNTER — Other Ambulatory Visit: Payer: Self-pay | Admitting: Nurse Practitioner

## 2018-05-29 DIAGNOSIS — F3341 Major depressive disorder, recurrent, in partial remission: Secondary | ICD-10-CM

## 2018-05-29 NOTE — Telephone Encounter (Signed)
MMM Last seen 07/25/17 Needs to be seen

## 2018-05-30 NOTE — Telephone Encounter (Signed)
appt made

## 2018-06-04 ENCOUNTER — Ambulatory Visit (INDEPENDENT_AMBULATORY_CARE_PROVIDER_SITE_OTHER): Payer: Self-pay | Admitting: Nurse Practitioner

## 2018-06-04 ENCOUNTER — Encounter: Payer: Self-pay | Admitting: Nurse Practitioner

## 2018-06-04 VITALS — BP 124/75 | HR 111 | Temp 98.2°F | Ht 64.0 in | Wt 127.0 lb

## 2018-06-04 DIAGNOSIS — E785 Hyperlipidemia, unspecified: Secondary | ICD-10-CM

## 2018-06-04 DIAGNOSIS — F332 Major depressive disorder, recurrent severe without psychotic features: Secondary | ICD-10-CM

## 2018-06-04 DIAGNOSIS — F319 Bipolar disorder, unspecified: Secondary | ICD-10-CM

## 2018-06-04 DIAGNOSIS — I10 Essential (primary) hypertension: Secondary | ICD-10-CM

## 2018-06-04 DIAGNOSIS — J0100 Acute maxillary sinusitis, unspecified: Secondary | ICD-10-CM

## 2018-06-04 MED ORDER — ATORVASTATIN CALCIUM 40 MG PO TABS: ORAL_TABLET | ORAL | 1 refills | Status: DC

## 2018-06-04 MED ORDER — VENLAFAXINE HCL ER 75 MG PO CP24: ORAL_CAPSULE | ORAL | 2 refills | Status: DC

## 2018-06-04 MED ORDER — QUETIAPINE FUMARATE 200 MG PO TABS: 200.0000 mg | ORAL_TABLET | Freq: Every day | ORAL | 5 refills | Status: DC

## 2018-06-04 MED ORDER — FENOFIBRATE 145 MG PO TABS: 145.0000 mg | ORAL_TABLET | Freq: Every day | ORAL | 1 refills | Status: DC

## 2018-06-04 MED ORDER — LISINOPRIL-HYDROCHLOROTHIAZIDE 20-12.5 MG PO TABS: 1.0000 | ORAL_TABLET | Freq: Every day | ORAL | 1 refills | Status: DC

## 2018-06-04 MED ORDER — AMOXICILLIN-POT CLAVULANATE 875-125 MG PO TABS: 1.0000 | ORAL_TABLET | Freq: Two times a day (BID) | ORAL | 0 refills | Status: DC

## 2018-06-04 NOTE — Addendum Note (Signed)
Addended by: Bennie PieriniMARTIN, MARY-MARGARET on: 06/04/2018 09:46 AM   Modules accepted: Orders

## 2018-06-04 NOTE — Progress Notes (Addendum)
Subjective:    Patient ID: Lindsay Dickerson, female    DOB: Aug 17, 1971, 47 y.o.   MRN: [id number]   Chief Complaint: Medical Management of Chronic Issues (cough and congestion and vomiting for 1 month)   HPI:  1. Essential hypertension  No c/o chest pain, sob or headache. Does not check blood pressure at home. BP Readings from Last 3 Encounters:  06/04/18 124/75  07/25/17 102/68  04/21/17 106/70     2. Hyperlipidemia with target LDL less than 100  Is on lipitor and fenofibrate  3. Severe episode of recurrent major depressive disorder, without psychotic features [facility])  Patient is on effexor and xanax only on occasion. She is doing better today then she has been doing. Her son died of drug overdose about 18 months ago and she is just gettting over that.  4. Bipolar 1 disorder ([place])  Is on seroquel which helps with her mood changes.    Outpatient Encounter Medications as of 06/04/2018  Medication Sig  . ALPRAZolam (XANAX) 0.25 MG tablet Take 1 tablet (0.25 mg total) by mouth 2 (two) times daily as needed for anxiety.  Marland Kitchen atorvastatin (LIPITOR) 40 MG tablet TAKE 1 TABLET BY MOUTH ONCE DAILY AT  6PM  . fenofibrate (TRICOR) 145 MG tablet Take 1 tablet (145 mg total) by mouth daily.  Marland Kitchen lisinopril-hydrochlorothiazide (PRINZIDE,ZESTORETIC) 20-12.5 MG tablet TAKE 1 TABLET BY MOUTH ONCE DAILY  . metroNIDAZOLE (METROGEL) 0.75 % vaginal gel Place vaginally at bedtime.  Marland Kitchen QUEtiapine (SEROQUEL) 200 MG tablet Take 1 tablet (200 mg total) by mouth at bedtime.  Marland Kitchen venlafaxine XR (EFFEXOR-XR) 75 MG 24 hr capsule TAKE 1 CAPSULE BY MOUTH IN THE MORNING WITH BREAKFAST       New complaints: C/o cough and congestion for over a week. No fevertoday. Decreased appetite with slight headache.  Social history: Lives with boyfriend. Has a daughter that lives with her grandma that she sees daily.     Review of Systems  Constitutional: Positive for chills. Negative for activity change, appetite  change and fever.  HENT: Positive for congestion, ear pain, sinus pressure and sinus pain. Negative for sore throat and trouble swallowing.   Eyes: Negative for pain.  Respiratory: Positive for cough (deep wet cough). Negative for shortness of breath.   Cardiovascular: Negative for chest pain, palpitations and leg swelling.  Gastrointestinal: Negative for abdominal pain.  Endocrine: Negative for polydipsia.  Genitourinary: Negative.   Skin: Negative for rash.  Neurological: Negative for dizziness, weakness and headaches.  Hematological: Does not bruise/bleed easily.  Psychiatric/Behavioral: Negative.   All other systems reviewed and are negative.      Objective:   Physical Exam  Constitutional: She is oriented to person, place, and time. She appears well-developed and well-nourished. She appears distressed (mild).  HENT:  Right Ear: Hearing, tympanic membrane, external ear and ear canal normal.  Left Ear: Hearing, tympanic membrane, external ear and ear canal normal.  Nose: Mucosal edema and rhinorrhea present. Right sinus exhibits maxillary sinus tenderness. Right sinus exhibits no frontal sinus tenderness. Left sinus exhibits maxillary sinus tenderness. Left sinus exhibits no frontal sinus tenderness.  Mouth/Throat: Uvula is midline, oropharynx is clear and moist and mucous membranes are normal.  Eyes: Pupils are equal, round, and reactive to light.  Neck: Normal range of motion. Neck supple.  Cardiovascular: Normal rate and regular rhythm.  Pulmonary/Chest: Effort normal and breath sounds normal.  Abdominal: Soft.  Lymphadenopathy:    She has no cervical adenopathy.  Neurological: She  is alert and oriented to person, place, and time.  Skin: Skin is warm.  Psychiatric: She has a normal mood and affect. Her behavior is normal. Thought content normal.   BP 124/75   Pulse (!) 111   Temp 98.2 F (36.8 C) (Oral)   Ht 5' 4"  (1.626 m)   Wt 127 lb (57.6 kg)   BMI 21.80 kg/m        Assessment & Plan:  Lindsay Dickerson comes in today with chief complaint of Medical Management of Chronic Issues (cough and congestion and vomiting for 1 month)   Diagnosis and orders addressed:  1. Essential hypertension Low sodium diet - lisinopril-hydrochlorothiazide (PRINZIDE,ZESTORETIC) 20-12.5 MG tablet; Take 1 tablet by mouth daily.  Dispense: 90 tablet; Refill: 1 - CMP14+EGFR  2. Hyperlipidemia with target LDL less than 100 Low fat diet - atorvastatin (LIPITOR) 40 MG tablet; TAKE 1 TABLET BY MOUTH ONCE DAILY AT  6PM  Dispense: 90 tablet; Refill: 1 - fenofibrate (TRICOR) 145 MG tablet; Take 1 tablet (145 mg total) by mouth daily.  Dispense: 90 tablet; Refill: 1 - Lipid panel  3. Severe episode of recurrent major depressive disorder, without psychotic features ([place]) Stress management - venlafaxine XR (EFFEXOR-XR) 75 MG 24 hr capsule; TAKE 1 CAPSULE BY MOUTH IN THE MORNING WITH BREAKFAST  Dispense: 30 capsule; Refill: 2  4. Bipolar 1 disorder (HCC) - QUEtiapine (SEROQUEL) 200 MG tablet; Take 1 tablet (200 mg total) by mouth at bedtime.  Dispense: 30 tablet; Refill: 5  5. Sinusitis -augmnetin 1 po bid for 7 days#14 no refills 1. Take meds as prescribed 2. Use a cool mist humidifier especially during the winter months and when heat has been humid. 3. Use saline nose sprays frequently 4. Saline irrigations of the nose can be very helpful if done frequently.  * 4X daily for 1 week*  * Use of a nettie pot can be helpful with this. Follow directions with this* 5. Drink plenty of fluids 6. Keep thermostat turn down low 7.For any cough or congestion  Use plain Mucinex- regular strength or max strength is fine   * Children- consult with Pharmacist for dosing 8. For fever or aces or pains- take tylenol or ibuprofen appropriate for age and weight.  * for fevers greater than 101 orally you may alternate ibuprofen and tylenol every  3 hours.     Labs pending Health  Maintenance reviewed Diet and exercise encouraged  Follow up plan: 6 months   Mary-Margaret Hassell Done, FNP

## 2018-06-04 NOTE — Patient Instructions (Signed)
Stress and Stress Management Stress is a normal reaction to life events. It is what you feel when life demands more than you are used to or more than you can handle. Some stress can be useful. For example, the stress reaction can help you catch the last bus of the day, study for a test, or meet a deadline at work. But stress that occurs too often or for too long can cause problems. It can affect your emotional health and interfere with relationships and normal daily activities. Too much stress can weaken your immune system and increase your risk for physical illness. If you already have a medical problem, stress can make it worse. What are the causes? All sorts of life events may cause stress. An event that causes stress for one person may not be stressful for another person. Major life events commonly cause stress. These may be positive or negative. Examples include losing your job, moving into a new home, getting married, having a baby, or losing a loved one. Less obvious life events may also cause stress, especially if they occur day after day or in combination. Examples include working long hours, driving in traffic, caring for children, being in debt, or being in a difficult relationship. What are the signs or symptoms? Stress may cause emotional symptoms including, the following:  Anxiety. This is feeling worried, afraid, on edge, overwhelmed, or out of control.  Anger. This is feeling irritated or impatient.  Depression. This is feeling sad, down, helpless, or guilty.  Difficulty focusing, remembering, or making decisions.  Stress may cause physical symptoms, including the following:  Aches and pains. These may affect your head, neck, back, stomach, or other areas of your body.  Tight muscles or clenched jaw.  Low energy or trouble sleeping.  Stress may cause unhealthy behaviors, including the following:  Eating to feel better (overeating) or skipping meals.  Sleeping too little,  too much, or both.  Working too much or putting off tasks (procrastination).  Smoking, drinking alcohol, or using drugs to feel better.  How is this diagnosed? Stress is diagnosed through an assessment by your health care provider. Your health care provider will ask questions about your symptoms and any stressful life events.Your health care provider will also ask about your medical history and may order blood tests or other tests. Certain medical conditions and medicine can cause physical symptoms similar to stress. Mental illness can cause emotional symptoms and unhealthy behaviors similar to stress. Your health care provider may refer you to a mental health professional for further evaluation. How is this treated? Stress management is the recommended treatment for stress.The goals of stress management are reducing stressful life events and coping with stress in healthy ways. Techniques for reducing stressful life events include the following:  Stress identification. Self-monitor for stress and identify what causes stress for you. These skills may help you to avoid some stressful events.  Time management. Set your priorities, keep a calendar of events, and learn to say "no." These tools can help you avoid making too many commitments.  Techniques for coping with stress include the following:  Rethinking the problem. Try to think realistically about stressful events rather than ignoring them or overreacting. Try to find the positives in a stressful situation rather than focusing on the negatives.  Exercise. Physical exercise can release both physical and emotional tension. The key is to find a form of exercise you enjoy and do it regularly.  Relaxation techniques. These relax the body and  mind. Examples include yoga, meditation, tai chi, biofeedback, deep breathing, progressive muscle relaxation, listening to music, being out in nature, journaling, and other hobbies. Again, the key is to find  one or more that you enjoy and can do regularly.  Healthy lifestyle. Eat a balanced diet, get plenty of sleep, and do not smoke. Avoid using alcohol or drugs to relax.  Strong support network. Spend time with family, friends, or other people you enjoy being around.Express your feelings and talk things over with someone you trust.  Counseling or talktherapy with a mental health professional may be helpful if you are having difficulty managing stress on your own. Medicine is typically not recommended for the treatment of stress.Talk to your health care provider if you think you need medicine for symptoms of stress. Follow these instructions at home:  Keep all follow-up visits as directed by your health care provider.  Take all medicines as directed by your health care provider. Contact a health care provider if:  Your symptoms get worse or you start having new symptoms.  You feel overwhelmed by your problems and can no longer manage them on your own. Get help right away if:  You feel like hurting yourself or someone else. This information is not intended to replace advice given to you by your health care provider. Make sure you discuss any questions you have with your health care provider. Document Released: 03/05/2001 Document Revised: 02/15/2016 Document Reviewed: 05/04/2013 Elsevier Interactive Patient Education  2017 [employer] Inc[employer]  

## 2018-06-04 NOTE — Addendum Note (Signed)
Addended by: Bennie PieriniMARTIN, MARY-MARGARET on: 06/04/2018 09:42 AM   Modules accepted: Level of Service

## 2018-06-05 LAB — CMP14+EGFR
A/G RATIO: 1.5 (ref 1.2–2.2)
ALK PHOS: 104 IU/L (ref 39–117)
ALT: 42 IU/L — AB (ref 0–32)
AST: 78 IU/L — AB (ref 0–40)
Albumin: 4.4 g/dL (ref 3.5–5.5)
BUN / CREAT RATIO: 17 (ref 9–23)
BUN: 12 mg/dL (ref 6–24)
Bilirubin Total: 0.8 mg/dL (ref 0.0–1.2)
CALCIUM: 9.6 mg/dL (ref 8.7–10.2)
CHLORIDE: 96 mmol/L (ref 96–106)
CO2: 19 mmol/L — ABNORMAL LOW (ref 20–29)
CREATININE: 0.69 mg/dL (ref 0.57–1.00)
GFR calc Af Amer: 118 mL/min/{1.73_m2} (ref >59)
GFR calc non Af Amer: 103 mL/min/{1.73_m2} (ref >59)
Globulin, Total: 2.9 g/dL (ref 1.5–4.5)
Glucose: 163 mg/dL — ABNORMAL HIGH (ref 65–99)
Potassium: 4 mmol/L (ref 3.5–5.2)
SODIUM: 134 mmol/L (ref 134–144)
Total Protein: 7.3 g/dL (ref 6.0–8.5)

## 2018-06-05 LAB — LIPID PANEL
Chol/HDL Ratio: 3.8 ratio (ref 0.0–4.4)
Cholesterol, Total: 92 mg/dL — ABNORMAL LOW (ref 100–199)
HDL: 24 mg/dL — ABNORMAL LOW (ref >39)
LDL CALC: 52 mg/dL (ref 0–99)
Triglycerides: 82 mg/dL (ref 0–149)
VLDL Cholesterol Cal: 16 mg/dL (ref 5–40)

## 2018-08-13 ENCOUNTER — Telehealth: Payer: Self-pay | Admitting: Nurse Practitioner

## 2018-09-16 IMAGING — DX DG CHEST 2V
2 series · 2 of 2 positions shown · non-contrast
Comparison: None.

CLINICAL DATA: Cough and congestion for several weeks.

EXAM:
CHEST  2 VIEW

[chest pa]
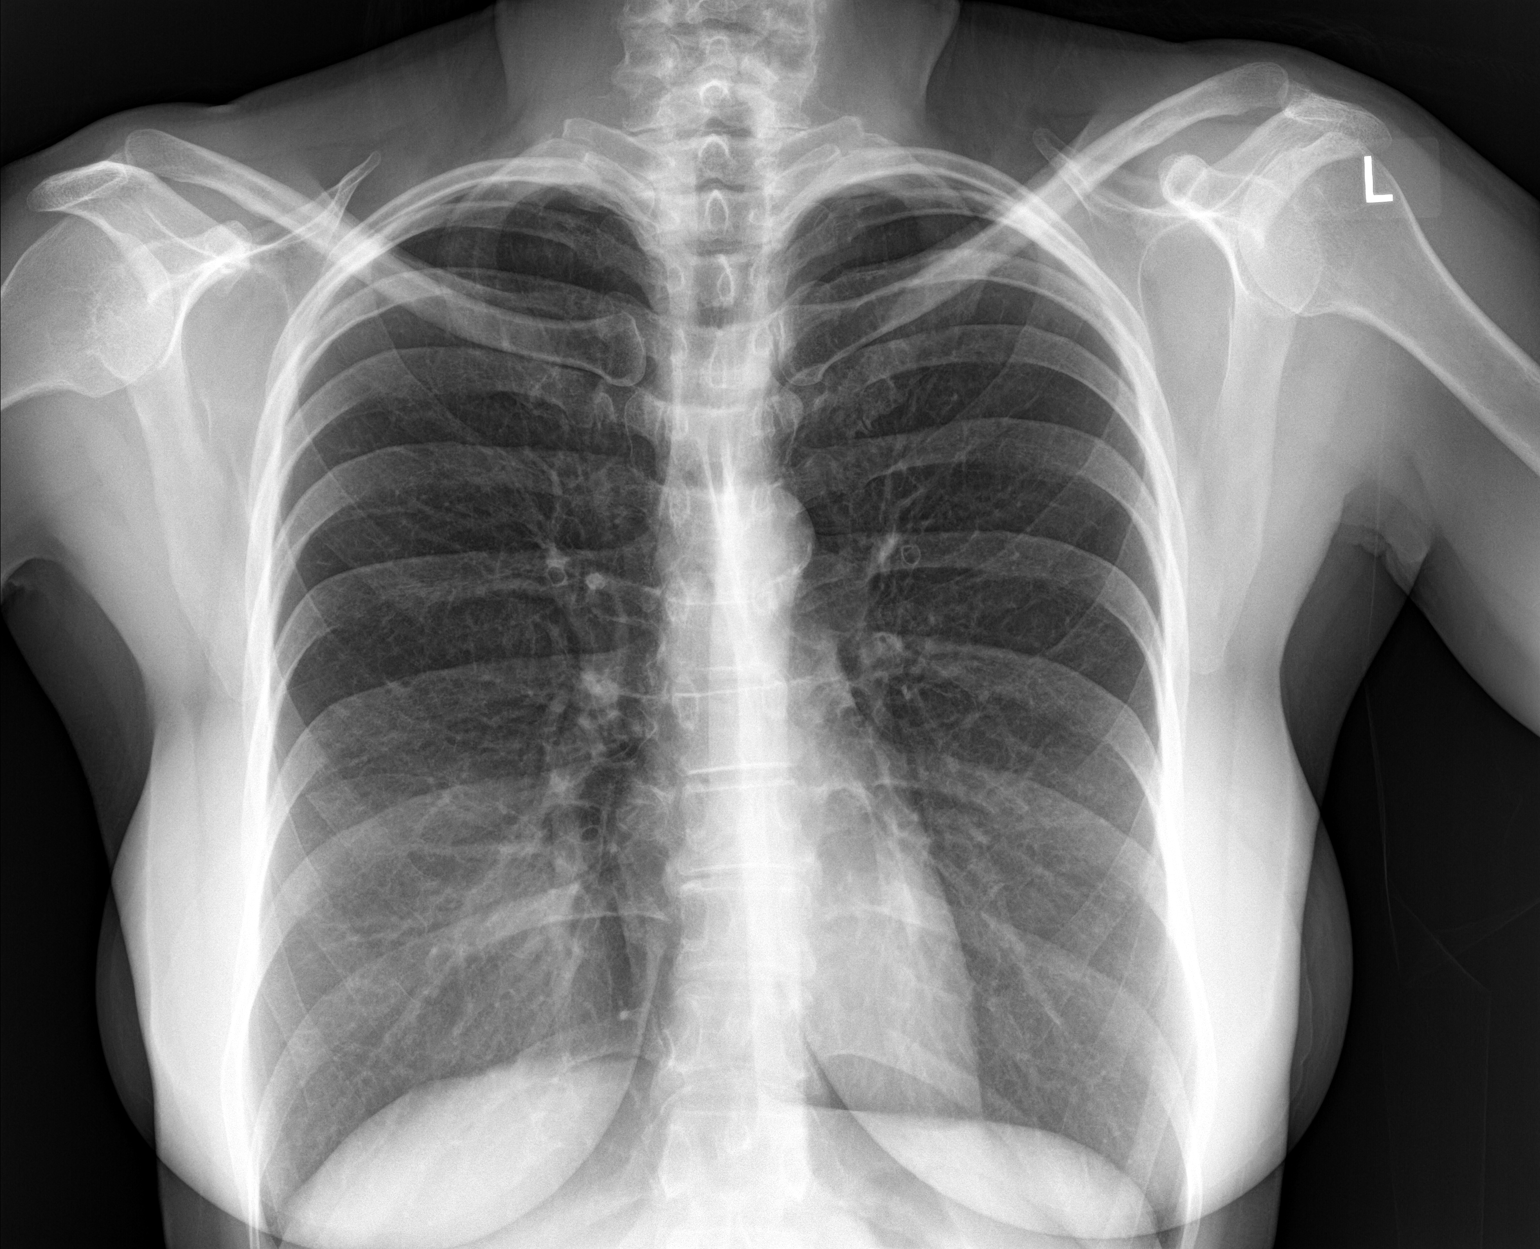

[chest lat]
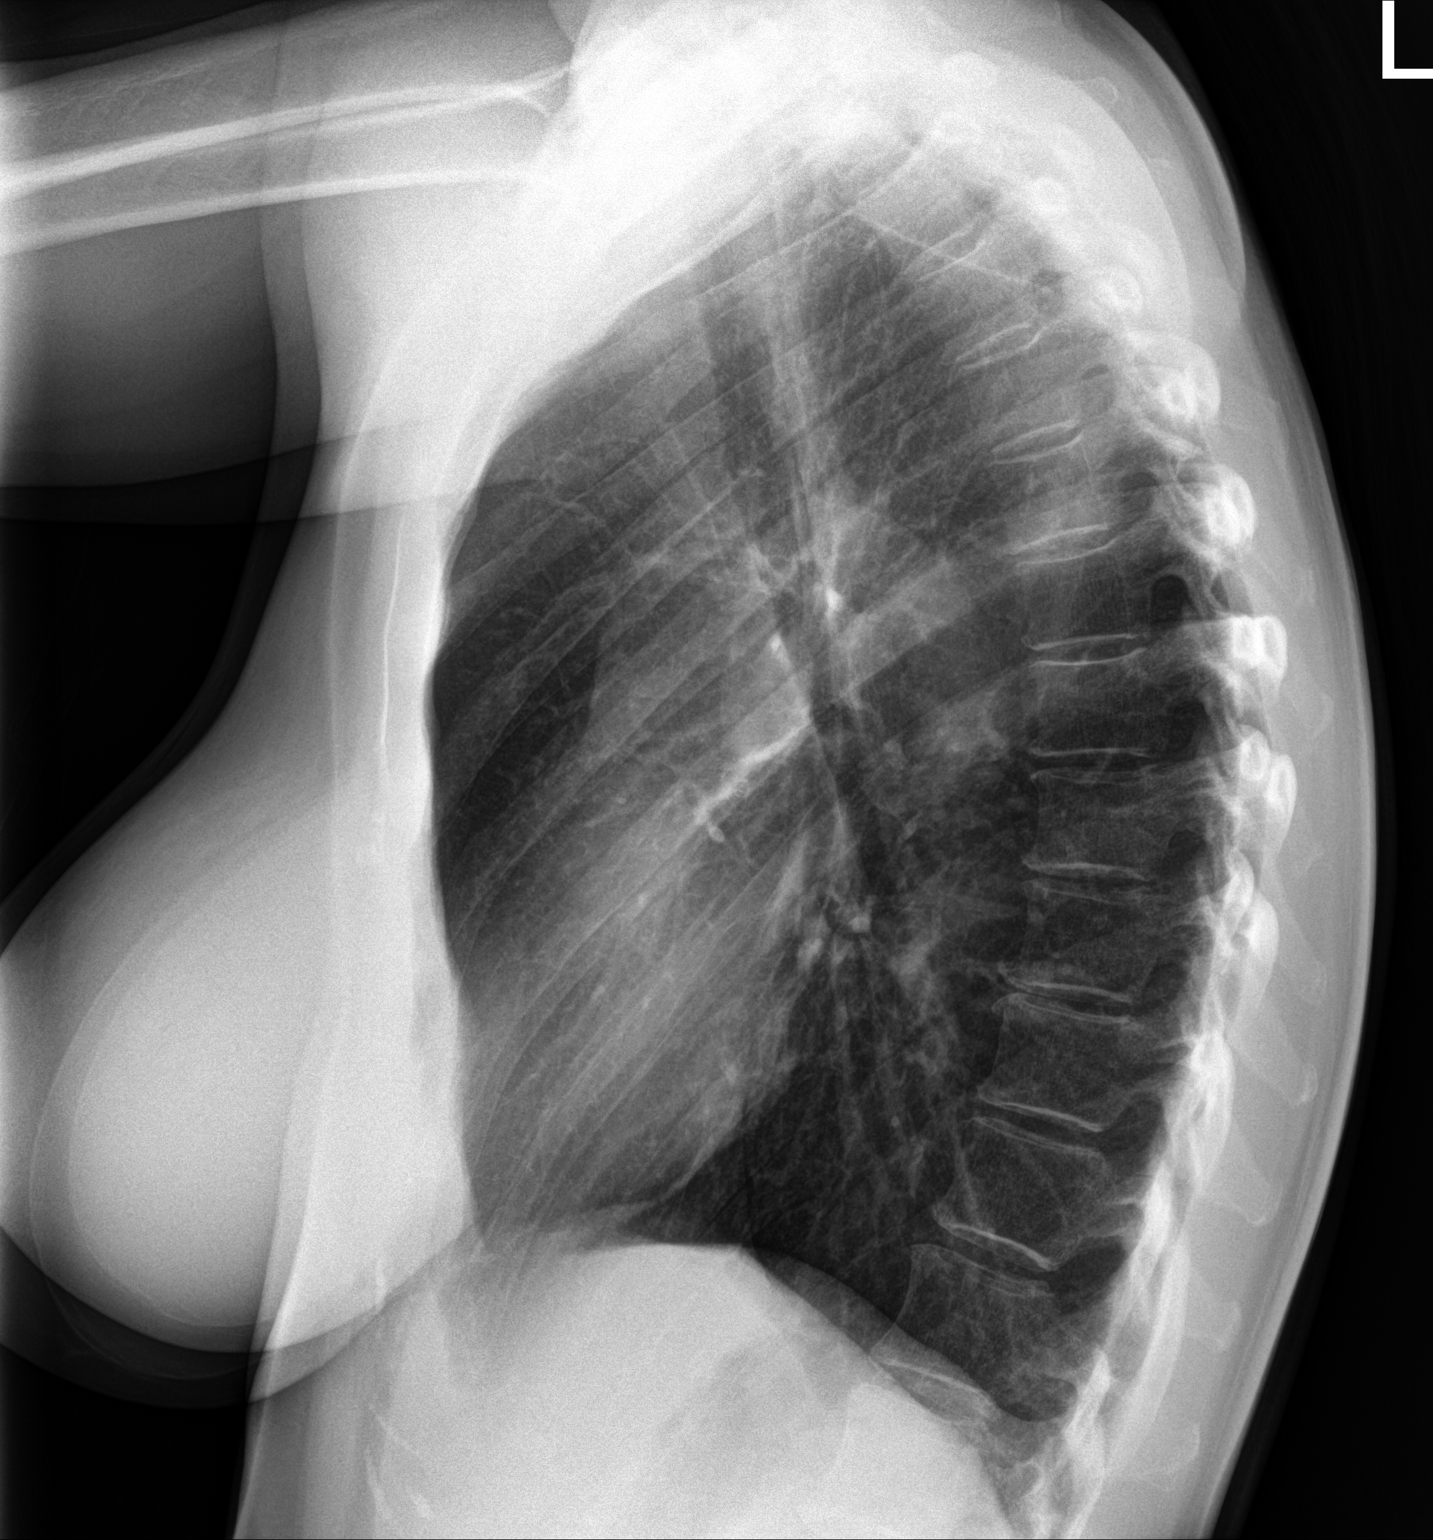

[2 of 2 positions shown; findings below may reference images not displayed]

FINDINGS: Normal heart size and mediastinal contours. No acute infiltrate or
edema. No effusion or pneumothorax. Breast implants present. No
acute osseous findings.
IMPRESSION: No active cardiopulmonary disease.

## 2018-09-18 ENCOUNTER — Other Ambulatory Visit: Payer: Self-pay | Admitting: Pediatrics

## 2018-09-18 DIAGNOSIS — F332 Major depressive disorder, recurrent severe without psychotic features: Secondary | ICD-10-CM

## 2018-09-26 ENCOUNTER — Other Ambulatory Visit: Payer: Self-pay | Admitting: Nurse Practitioner

## 2018-09-26 DIAGNOSIS — F332 Major depressive disorder, recurrent severe without psychotic features: Secondary | ICD-10-CM

## 2018-10-03 ENCOUNTER — Other Ambulatory Visit: Payer: Self-pay | Admitting: Nurse Practitioner

## 2018-10-03 DIAGNOSIS — E785 Hyperlipidemia, unspecified: Secondary | ICD-10-CM

## 2018-10-06 NOTE — Telephone Encounter (Signed)
Next OV 06/05/19

## 2018-10-30 ENCOUNTER — Other Ambulatory Visit: Payer: Self-pay | Admitting: Nurse Practitioner

## 2018-10-30 DIAGNOSIS — F332 Major depressive disorder, recurrent severe without psychotic features: Secondary | ICD-10-CM

## 2018-12-01 ENCOUNTER — Other Ambulatory Visit: Payer: Self-pay | Admitting: Nurse Practitioner

## 2018-12-01 DIAGNOSIS — F332 Major depressive disorder, recurrent severe without psychotic features: Secondary | ICD-10-CM

## 2018-12-02 NOTE — Telephone Encounter (Signed)
MMM. NTBS. 12/02/18 appt not rescheduled by pt. 30 days given 10/30/18

## 2018-12-02 NOTE — Telephone Encounter (Signed)
appt made.  Patient aware  

## 2018-12-03 ENCOUNTER — Encounter: Payer: Self-pay | Admitting: Nurse Practitioner

## 2018-12-03 ENCOUNTER — Other Ambulatory Visit: Payer: Self-pay

## 2018-12-03 ENCOUNTER — Ambulatory Visit (INDEPENDENT_AMBULATORY_CARE_PROVIDER_SITE_OTHER): Payer: Medicaid Other | Admitting: Nurse Practitioner

## 2018-12-03 VITALS — BP 118/75 | HR 69 | Temp 97.8°F | Ht 62.0 in | Wt 125.0 lb

## 2018-12-03 DIAGNOSIS — I10 Essential (primary) hypertension: Secondary | ICD-10-CM

## 2018-12-03 DIAGNOSIS — F332 Major depressive disorder, recurrent severe without psychotic features: Secondary | ICD-10-CM

## 2018-12-03 DIAGNOSIS — F319 Bipolar disorder, unspecified: Secondary | ICD-10-CM

## 2018-12-03 DIAGNOSIS — E785 Hyperlipidemia, unspecified: Secondary | ICD-10-CM

## 2018-12-03 MED ORDER — ALPRAZOLAM 0.25 MG PO TABS: 0.2500 mg | ORAL_TABLET | Freq: Two times a day (BID) | ORAL | 1 refills | Status: DC | PRN

## 2018-12-03 MED ORDER — FENOFIBRATE 145 MG PO TABS: 145.0000 mg | ORAL_TABLET | Freq: Every day | ORAL | 1 refills | Status: DC

## 2018-12-03 MED ORDER — LISINOPRIL-HYDROCHLOROTHIAZIDE 20-12.5 MG PO TABS: 1.0000 | ORAL_TABLET | Freq: Every day | ORAL | 1 refills | Status: DC

## 2018-12-03 MED ORDER — ATORVASTATIN CALCIUM 40 MG PO TABS: 40.0000 mg | ORAL_TABLET | Freq: Every day | ORAL | 1 refills | Status: DC

## 2018-12-03 MED ORDER — QUETIAPINE FUMARATE 200 MG PO TABS: 200.0000 mg | ORAL_TABLET | Freq: Every day | ORAL | 5 refills | Status: DC

## 2018-12-03 MED ORDER — VENLAFAXINE HCL ER 75 MG PO CP24: 75.0000 mg | ORAL_CAPSULE | Freq: Every day | ORAL | 1 refills | Status: DC

## 2018-12-03 NOTE — Progress Notes (Signed)
Subjective:    Patient ID: Lindsay Dickerson, female    DOB: Jan 13, 1971, 48 y.o.   MRN: [id number]   Chief Complaint: Medical Management of Chronic Issues   HPI:  1. Essential hypertension  No c/o chest pain, sob or heaache. Does not check blood pressure. BP Readings from Last 3 Encounters:  06/04/18 124/75  07/25/17 102/68  04/21/17 106/70     2. Hyperlipidemia with target LDL less than 100  Does not watch diet and does very little dedicated exercise.  3. Severe episode of recurrent major depressive disorder, without psychotic features ([place]) Is on effexor daily and she says it is helping with her depression. Depression screen [facility] 2/9 06/04/2018 07/25/2017 04/21/2017  Decreased Interest 0 2 2  Down, Depressed, Hopeless 0 1 1  PHQ - 2 Score 0 3 3  Altered sleeping - 2 2  Tired, decreased energy - 2 2  Change in appetite - 1 1  Feeling bad or failure about yourself  - 1 1  Trouble concentrating - 1 1  Moving slowly or fidgety/restless - 0 1  Suicidal thoughts - 0 0  PHQ-9 Score - 10 11  Difficult doing work/chores - - Somewhat difficult     4. Bipolar 1 disorder ([place])  Is on seroquel daily which really helps with mood swings. She does take a xanax 0.25 prn    Outpatient Encounter Medications as of 12/03/2018  Medication Sig  . ALPRAZolam (XANAX) 0.25 MG tablet Take 1 tablet (0.25 mg total) by mouth 2 (two) times daily as needed for anxiety.  Marland Kitchen amoxicillin-clavulanate (AUGMENTIN) 875-125 MG tablet Take 1 tablet by mouth 2 (two) times daily.  Marland Kitchen atorvastatin (LIPITOR) 40 MG tablet TAKE 1 TABLET BY MOUTH ONCE DAILY AT 6PM  . fenofibrate (TRICOR) 145 MG tablet Take 1 tablet (145 mg total) by mouth daily.  Marland Kitchen lisinopril-hydrochlorothiazide (PRINZIDE,ZESTORETIC) 20-12.5 MG tablet Take 1 tablet by mouth daily.  . metroNIDAZOLE (METROGEL) 0.75 % vaginal gel Place vaginally at bedtime.  Marland Kitchen QUEtiapine (SEROQUEL) 200 MG tablet Take 1 tablet (200 mg total) by mouth at bedtime.  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   Review of Systems  Constitutional: Negative for activity change and appetite change.  HENT: Negative.   Eyes: Negative for pain.  Respiratory: Negative for shortness of breath.   Cardiovascular: Negative for chest pain, palpitations and leg swelling.  Gastrointestinal: Negative for abdominal pain.  Endocrine: Negative for polydipsia.  Genitourinary: Negative.   Skin: Negative for rash.  Neurological: Negative for dizziness, weakness and headaches.  Hematological: Does not bruise/bleed easily.  Psychiatric/Behavioral: Negative.   All other systems reviewed and are negative.      Objective:   Physical Exam Vitals signs and nursing note reviewed.  Constitutional:      General: She is not in acute distress.    Appearance: Normal appearance. She is well-developed.  HENT:     Head: Normocephalic.     Nose: Nose normal.  Eyes:     Pupils: Pupils are equal, round, and reactive to light.  Neck:     Musculoskeletal: Normal range of motion and neck supple.     Vascular: No carotid bruit or JVD.  Cardiovascular:     Rate and Rhythm: Normal rate and regular rhythm.  Heart sounds: Normal heart sounds.  Pulmonary:     Effort: Pulmonary effort is normal. No respiratory distress.     Breath sounds: Normal breath sounds. No wheezing or rales.  Chest:     Chest wall: No tenderness.  Abdominal:     General: Bowel sounds are normal. There is no distension or abdominal bruit.     Palpations: Abdomen is soft. There is no hepatomegaly, splenomegaly, mass or pulsatile mass.     Tenderness: There is no abdominal tenderness.  Musculoskeletal: Normal range of motion.  Lymphadenopathy:     Cervical: No cervical adenopathy.  Skin:    General: Skin is warm and dry.   Neurological:     Mental Status: She is alert and oriented to person, place, and time.     Deep Tendon Reflexes: Reflexes are normal and symmetric.  Psychiatric:        Behavior: Behavior normal.        Thought Content: Thought content normal.        Judgment: Judgment normal.     BP 118/75   Pulse 69   Temp 97.8 F (36.6 C) (Oral)   Ht _0  (1.575 m)   Wt 125 lb (56.7 kg)   BMI 22.86 kg/m         Assessment & Plan:  SHARRELL KRAWIEC comes in today with chief complaint of Medical Management of Chronic Issues   Diagnosis and orders addressed:  1. Essential hypertension Low sodium diet - lisinopril-hydrochlorothiazide (PRINZIDE,ZESTORETIC) 20-12.5 MG tablet; Take 1 tablet by mouth daily.  Dispense: 90 tablet; Refill: 1 - CMP14+EGFR  2. Hyperlipidemia with target LDL less than 100 Low fat diet - atorvastatin (LIPITOR) 40 MG tablet; Take 1 tablet (40 mg total) by mouth daily at 6 PM.  Dispense: 90 tablet; Refill: 1 - fenofibrate (TRICOR) 145 MG tablet; Take 1 tablet (145 mg total) by mouth daily.  Dispense: 90 tablet; Refill: 1 - Lipid panel  3. Severe episode of recurrent major depressive disorder, without psychotic features ([place]) Stress management - venlafaxine XR (EFFEXOR-XR) 75 MG 24 hr capsule; Take 1 capsule (75 mg total) by mouth daily with breakfast.  Dispense: 90 capsule; Refill: 1  4. Bipolar 1 disorder (HCC) - QUEtiapine (SEROQUEL) 200 MG tablet; Take 1 tablet (200 mg total) by mouth at bedtime.  Dispense: 30 tablet; Refill: 5 - ALPRAZolam (XANAX) 0.25 MG tablet; Take 1 tablet (0.25 mg total) by mouth 2 (two) times daily as needed for anxiety.  Dispense: 40 tablet; Refill: 1   Labs pending Health Maintenance reviewed Diet and exercise encouraged  Follow up plan: 6 months   Mary-Margaret Hassell Done, FNP

## 2018-12-08 ENCOUNTER — Telehealth: Payer: Self-pay | Admitting: Nurse Practitioner

## 2018-12-08 DIAGNOSIS — I10 Essential (primary) hypertension: Secondary | ICD-10-CM

## 2018-12-08 DIAGNOSIS — F332 Major depressive disorder, recurrent severe without psychotic features: Secondary | ICD-10-CM

## 2018-12-08 DIAGNOSIS — F319 Bipolar disorder, unspecified: Secondary | ICD-10-CM

## 2018-12-08 DIAGNOSIS — E785 Hyperlipidemia, unspecified: Secondary | ICD-10-CM

## 2018-12-08 MED ORDER — VENLAFAXINE HCL ER 75 MG PO CP24: 75.0000 mg | ORAL_CAPSULE | Freq: Every day | ORAL | 1 refills | Status: DC

## 2018-12-08 MED ORDER — QUETIAPINE FUMARATE 200 MG PO TABS: 200.0000 mg | ORAL_TABLET | Freq: Every day | ORAL | 5 refills | Status: DC

## 2018-12-08 MED ORDER — ALPRAZOLAM 0.25 MG PO TABS: 0.2500 mg | ORAL_TABLET | Freq: Two times a day (BID) | ORAL | 1 refills | Status: DC | PRN

## 2018-12-08 MED ORDER — LISINOPRIL-HYDROCHLOROTHIAZIDE 20-12.5 MG PO TABS: 1.0000 | ORAL_TABLET | Freq: Every day | ORAL | 1 refills | Status: DC

## 2018-12-08 MED ORDER — FENOFIBRATE 145 MG PO TABS: 145.0000 mg | ORAL_TABLET | Freq: Every day | ORAL | 1 refills | Status: DC

## 2018-12-08 MED ORDER — ATORVASTATIN CALCIUM 40 MG PO TABS: 40.0000 mg | ORAL_TABLET | Freq: Every day | ORAL | 1 refills | Status: DC

## 2018-12-08 NOTE — Telephone Encounter (Signed)
rx sent to [facility]

## 2018-12-08 NOTE — Telephone Encounter (Signed)
rx sent to [facility] pharmacy

## 2018-12-08 NOTE — Telephone Encounter (Signed)
Patient aware.

## 2019-05-12 ENCOUNTER — Other Ambulatory Visit: Payer: Self-pay | Admitting: Nurse Practitioner

## 2019-05-12 DIAGNOSIS — E785 Hyperlipidemia, unspecified: Secondary | ICD-10-CM

## 2019-05-14 ENCOUNTER — Telehealth: Payer: Self-pay | Admitting: Nurse Practitioner

## 2019-05-14 DIAGNOSIS — F319 Bipolar disorder, unspecified: Secondary | ICD-10-CM

## 2019-05-14 MED ORDER — QUETIAPINE FUMARATE 200 MG PO TABS: 200.0000 mg | ORAL_TABLET | Freq: Every day | ORAL | 5 refills | Status: DC

## 2019-05-14 NOTE — Telephone Encounter (Signed)
Will refill meds but needs to be seen

## 2019-05-14 NOTE — Telephone Encounter (Signed)
Patient aware and appt made 

## 2019-05-24 ENCOUNTER — Ambulatory Visit: Payer: Medicaid Other | Admitting: Nurse Practitioner

## 2019-06-02 ENCOUNTER — Encounter: Payer: Self-pay | Admitting: Nurse Practitioner

## 2019-06-30 ENCOUNTER — Encounter: Payer: Self-pay | Admitting: Nurse Practitioner

## 2019-06-30 ENCOUNTER — Ambulatory Visit (INDEPENDENT_AMBULATORY_CARE_PROVIDER_SITE_OTHER): Payer: Medicaid Other | Admitting: Nurse Practitioner

## 2019-06-30 DIAGNOSIS — F332 Major depressive disorder, recurrent severe without psychotic features: Secondary | ICD-10-CM

## 2019-06-30 DIAGNOSIS — I10 Essential (primary) hypertension: Secondary | ICD-10-CM

## 2019-06-30 DIAGNOSIS — F319 Bipolar disorder, unspecified: Secondary | ICD-10-CM

## 2019-06-30 DIAGNOSIS — E785 Hyperlipidemia, unspecified: Secondary | ICD-10-CM

## 2019-06-30 MED ORDER — FENOFIBRATE 145 MG PO TABS: 145.0000 mg | ORAL_TABLET | Freq: Every day | ORAL | 1 refills | Status: DC

## 2019-06-30 MED ORDER — LISINOPRIL-HYDROCHLOROTHIAZIDE 20-12.5 MG PO TABS: 1.0000 | ORAL_TABLET | Freq: Every day | ORAL | 1 refills | Status: DC

## 2019-06-30 MED ORDER — VENLAFAXINE HCL ER 75 MG PO CP24: 75.0000 mg | ORAL_CAPSULE | Freq: Every day | ORAL | 1 refills | Status: DC

## 2019-06-30 MED ORDER — QUETIAPINE FUMARATE 200 MG PO TABS: 200.0000 mg | ORAL_TABLET | Freq: Every day | ORAL | 1 refills | Status: DC

## 2019-06-30 MED ORDER — ATORVASTATIN CALCIUM 40 MG PO TABS: 40.0000 mg | ORAL_TABLET | Freq: Every day | ORAL | 1 refills | Status: DC

## 2019-06-30 NOTE — Progress Notes (Signed)
Virtual Visit via telephone Note Due to COVID-19 pandemic this visit was conducted virtually. This visit type was conducted due to national recommendations for restrictions regarding the COVID-19 Pandemic (e.g. social distancing, sheltering in place) in an effort to limit this patient's exposure and mitigate transmission in our community. All issues noted in this document were discussed and addressed.  A physical exam was not performed with this format.  I connected with Lindsay Dickerson on 06/30/19 at 9:20 by telephone and verified that I am speaking with the correct person using two identifiers. Lindsay Dickerson is currently located at home and no one is currently with her during visit. The provider, Mary-Margaret Hassell Done, FNP is located in their office at time of visit.  I discussed the limitations, risks, security and privacy concerns of performing an evaluation and management service by telephone and the availability of in person appointments. I also discussed with the patient that there may be a patient responsible charge related to this service. The patient expressed understanding and agreed to proceed.   History and Present Illness:   Chief Complaint: Medical Management of Chronic Issues    HPI:  1. Essential hypertension No c/o chest pain, sob or chest pain. Does not check blood pressure at home. BP Readings from Last 3 Encounters:  12/03/18 118/75  06/04/18 124/75  07/25/17 102/68     2. Hyperlipidemia with target LDL less than 100 Does not watch diet and does very little exercise. Lab Results  Component Value Date   CHOL 92 (L) 06/04/2018   HDL 24 (L) 06/04/2018   LDLCALC 52 06/04/2018   TRIG 82 06/04/2018   CHOLHDL 3.8 06/04/2018     3. Severe episode of recurrent major depressive disorder, without psychotic features ([place]) Is on effexor daily and is working well. Depression screen [facility] 2/9 06/30/2019 06/04/2018 07/25/2017  Decreased Interest 0 0 2  Down,  Depressed, Hopeless 0 0 1  PHQ - 2 Score 0 0 3  Altered sleeping - - 2  Tired, decreased energy - - 2  Change in appetite - - 1  Feeling bad or failure about yourself  - - 1  Trouble concentrating - - 1  Moving slowly or fidgety/restless - - 0  Suicidal thoughts - - 0  PHQ-9 Score - - 10  Difficult doing work/chores - - -     4. Bipolar 1 disorder ([place]) Is on seroquel and os working well for her she is on xanax but very seldom takes them. She has a new man in her life and that has really changed her attitude.    Outpatient Encounter Medications as of 06/30/2019  Medication Sig  . ALPRAZolam (XANAX) 0.25 MG tablet Take 1 tablet (0.25 mg total) by mouth 2 (two) times daily as needed for anxiety.  Marland Kitchen amoxicillin-clavulanate (AUGMENTIN) 875-125 MG tablet Take 1 tablet by mouth 2 (two) times daily.  Marland Kitchen atorvastatin (LIPITOR) 40 MG tablet Take 1 tablet (40 mg total) by mouth daily at 6 PM.  . fenofibrate (TRICOR) 145 MG tablet Take 1 tablet (145 mg total) by mouth daily.  Marland Kitchen lisinopril-hydrochlorothiazide (PRINZIDE,ZESTORETIC) 20-12.5 MG tablet Take 1 tablet by mouth daily.  . metroNIDAZOLE (METROGEL) 0.75 % vaginal gel Place vaginally at bedtime.  Marland Kitchen QUEtiapine (SEROQUEL) 200 MG tablet Take 1 tablet (200 mg total) by mouth at bedtime.  Marland Kitchen venlafaxine XR (EFFEXOR-XR) 75 MG 24 hr capsule Take 1 capsule (75 mg total) by mouth daily with breakfast.     Past Surgical  History:  Procedure Laterality Date  . ABDOMINAL HYSTERECTOMY     partial, rt bso  . APPENDECTOMY    . BREAST SURGERY    . EYE SURGERY    . NASAL SEPTUM SURGERY    . THYROIDECTOMY, PARTIAL      Family History  Problem Relation Age of Onset  . Hypertension Mother   . Hyperlipidemia Father   . Hypertension Father     New complaints: None today  Social history: Lives with her new boyfriend and his daughters.  Controlled substance contract: will have signed at next visit    Review of Systems  Constitutional:  Negative for diaphoresis and weight loss.  Eyes: Negative for blurred vision, double vision and pain.  Respiratory: Negative for shortness of breath.   Cardiovascular: Negative for chest pain, palpitations, orthopnea and leg swelling.  Gastrointestinal: Negative for abdominal pain.  Skin: Negative for rash.  Neurological: Negative for dizziness, sensory change, loss of consciousness, weakness and headaches.  Endo/Heme/Allergies: Negative for polydipsia. Does not bruise/bleed easily.  Psychiatric/Behavioral: Negative for memory loss. The patient does not have insomnia.   All other systems reviewed and are negative.    Observations/Objective: Alert and oriented- answers all questions appropriately No distress    Assessment and Plan: Lindsay Dickerson comes in today with chief complaint of Medical Management of Chronic Issues   Diagnosis and orders addressed:  1. Essential hypertension Low sodium diet - lisinopril-hydrochlorothiazide (ZESTORETIC) 20-12.5 MG tablet; Take 1 tablet by mouth daily.  Dispense: 90 tablet; Refill: 1  2. Hyperlipidemia with target LDL less than 100 Low fat diet - atorvastatin (LIPITOR) 40 MG tablet; Take 1 tablet (40 mg total) by mouth daily at 6 PM.  Dispense: 90 tablet; Refill: 1 - fenofibrate (TRICOR) 145 MG tablet; Take 1 tablet (145 mg total) by mouth daily.  Dispense: 90 tablet; Refill: 1  3. Severe episode of recurrent major depressive disorder, without psychotic features (HCC) Stress management - venlafaxine XR (EFFEXOR-XR) 75 MG 24 hr capsule; Take 1 capsule (75 mg total) by mouth daily with breakfast.  Dispense: 90 capsule; Refill: 1  4. Bipolar 1 disorder (HCC) - QUEtiapine (SEROQUEL) 200 MG tablet; Take 1 tablet (200 mg total) by mouth at bedtime.  Dispense: 90 tablet; Refill: 1   Labs pending Health Maintenance reviewed Diet and exercise encouraged  Follow up plan: 3 months     I discussed the assessment and treatment plan with  the patient. The patient was provided an opportunity to ask questions and all were answered. The patient agreed with the plan and demonstrated an understanding of the instructions.   The patient was advised to call back or seek an in-person evaluation if the symptoms worsen or if the condition fails to improve as anticipated.  The above assessment and management plan was discussed with the patient. The patient verbalized understanding of and has agreed to the management plan. Patient is aware to call the clinic if symptoms persist or worsen. Patient is aware when to return to the clinic for a follow-up visit. Patient educated on when it is appropriate to go to the emergency department.   Time call ended:  9:34  I provided 14 minutes of non-face-to-face time during this encounter.    Mary-Margaret Daphine Deutscher, FNP

## 2019-08-04 ENCOUNTER — Other Ambulatory Visit: Payer: Self-pay | Admitting: *Deleted

## 2019-08-04 DIAGNOSIS — Z20822 Contact with and (suspected) exposure to covid-19: Secondary | ICD-10-CM

## 2019-08-06 LAB — NOVEL CORONAVIRUS, NAA: SARS-CoV-2, NAA: NOT DETECTED

## 2020-02-15 ENCOUNTER — Other Ambulatory Visit: Payer: Self-pay | Admitting: Nurse Practitioner

## 2020-02-15 DIAGNOSIS — E785 Hyperlipidemia, unspecified: Secondary | ICD-10-CM

## 2020-02-15 DIAGNOSIS — F319 Bipolar disorder, unspecified: Secondary | ICD-10-CM

## 2020-02-15 DIAGNOSIS — F332 Major depressive disorder, recurrent severe without psychotic features: Secondary | ICD-10-CM

## 2020-02-16 ENCOUNTER — Telehealth: Payer: Self-pay | Admitting: Nurse Practitioner

## 2020-02-16 DIAGNOSIS — F319 Bipolar disorder, unspecified: Secondary | ICD-10-CM

## 2020-02-16 DIAGNOSIS — F332 Major depressive disorder, recurrent severe without psychotic features: Secondary | ICD-10-CM

## 2020-02-16 DIAGNOSIS — I10 Essential (primary) hypertension: Secondary | ICD-10-CM

## 2020-02-16 NOTE — Telephone Encounter (Signed)
  Prescription Request  02/16/2020  What is the name of the medication or equipment? venlafaxine XR (EFFEXOR-XR) 75 MG 24 hr capsule QUEtiapine (SEROQUEL) 200 MG tablet  atorvastatin (LIPITOR) 40 MG tablet  fenofibrate (TRICOR) 145 MG tablet  lisinopril-hydrochlorothiazide (ZESTORETIC) 20-12.5 MG tablet   Have you contacted your pharmacy to request a refill? (if applicable) yes  Which pharmacy would you like this sent to? [facility]   Patient notified that their request is being sent to the clinical staff for review and that they should receive a response within 2 business days.

## 2020-02-17 NOTE — Telephone Encounter (Signed)
Ntbs.   

## 2020-02-17 NOTE — Telephone Encounter (Signed)
lmtcb

## 2020-02-18 MED ORDER — QUETIAPINE FUMARATE 200 MG PO TABS: 200.0000 mg | ORAL_TABLET | Freq: Every day | ORAL | 1 refills | Status: DC

## 2020-02-18 MED ORDER — VENLAFAXINE HCL ER 75 MG PO CP24: 75.0000 mg | ORAL_CAPSULE | Freq: Every day | ORAL | 1 refills | Status: DC

## 2020-02-18 MED ORDER — LISINOPRIL-HYDROCHLOROTHIAZIDE 20-12.5 MG PO TABS: 1.0000 | ORAL_TABLET | Freq: Every day | ORAL | 1 refills | Status: DC

## 2020-02-18 NOTE — Telephone Encounter (Signed)
Patient aware, three scripts are ready. Message left on her voice mail.

## 2020-02-18 NOTE — Telephone Encounter (Signed)
Only filled seroquel, effexor and zestoretic- the other can wait until he is seen

## 2020-02-25 ENCOUNTER — Encounter: Payer: Self-pay | Admitting: Nurse Practitioner

## 2020-02-25 ENCOUNTER — Ambulatory Visit (INDEPENDENT_AMBULATORY_CARE_PROVIDER_SITE_OTHER): Payer: Self-pay | Admitting: Nurse Practitioner

## 2020-02-25 ENCOUNTER — Other Ambulatory Visit: Payer: Self-pay

## 2020-02-25 VITALS — BP 117/73 | HR 104 | Temp 98.2°F | Ht 62.0 in | Wt 131.4 lb

## 2020-02-25 DIAGNOSIS — F319 Bipolar disorder, unspecified: Secondary | ICD-10-CM

## 2020-02-25 DIAGNOSIS — I10 Essential (primary) hypertension: Secondary | ICD-10-CM

## 2020-02-25 DIAGNOSIS — E785 Hyperlipidemia, unspecified: Secondary | ICD-10-CM

## 2020-02-25 DIAGNOSIS — F332 Major depressive disorder, recurrent severe without psychotic features: Secondary | ICD-10-CM

## 2020-02-25 MED ORDER — ATORVASTATIN CALCIUM 40 MG PO TABS: 40.0000 mg | ORAL_TABLET | Freq: Every day | ORAL | 1 refills | Status: DC

## 2020-02-25 MED ORDER — FENOFIBRATE 145 MG PO TABS: 145.0000 mg | ORAL_TABLET | Freq: Every day | ORAL | 1 refills | Status: DC

## 2020-02-25 MED ORDER — VENLAFAXINE HCL ER 75 MG PO CP24: 75.0000 mg | ORAL_CAPSULE | Freq: Every day | ORAL | 1 refills | Status: DC

## 2020-02-25 MED ORDER — ALPRAZOLAM 0.25 MG PO TABS: 0.2500 mg | ORAL_TABLET | Freq: Two times a day (BID) | ORAL | 1 refills | Status: DC | PRN

## 2020-02-25 MED ORDER — LISINOPRIL-HYDROCHLOROTHIAZIDE 20-12.5 MG PO TABS: 1.0000 | ORAL_TABLET | Freq: Every day | ORAL | 1 refills | Status: DC

## 2020-02-25 MED ORDER — QUETIAPINE FUMARATE 200 MG PO TABS: 200.0000 mg | ORAL_TABLET | Freq: Every day | ORAL | 1 refills | Status: DC

## 2020-02-25 NOTE — Progress Notes (Signed)
Subjective:    Patient ID: Lindsay Dickerson, female    DOB: Oct 16, 1970, 49 y.o.   MRN: [id number]   Chief Complaint: Medical Management of Chronic Issues    HPI:  1. Essential hypertension No c/o chest pain, sob or headache. Does not check blood pressure at home. BP Readings from Last 3 Encounters:  02/25/20 117/73  12/03/18 118/75  06/04/18 124/75     2. Hyperlipidemia with target LDL less than 100 Does not watch diet and does little to no exercise Lab Results  Component Value Date   CHOL 92 (L) 06/04/2018   HDL 24 (L) 06/04/2018   LDLCALC 52 06/04/2018   TRIG 82 06/04/2018   CHOLHDL 3.8 06/04/2018     3. Severe episode of recurrent major depressive disorder, without psychotic features [facility]) She is on effexor and Is doing well. She also takes xanax on occaion  Depression screen [facility] 2/9 02/25/2020 06/30/2019 06/04/2018  Decreased Interest 0 0 0  Down, Depressed, Hopeless 0 0 0  PHQ - 2 Score 0 0 0  Altered sleeping 2 - -  Tired, decreased energy 1 - -  Change in appetite 0 - -  Feeling bad or failure about yourself  0 - -  Trouble concentrating 0 - -  Moving slowly or fidgety/restless 0 - -  Suicidal thoughts 0 - -  PHQ-9 Score 3 - -  Difficult doing work/chores Not difficult at all - -    4. Bipolar 1 disorder (HCC) Again is on seroquel and is doing well. Says her mood wings re under good control.    Outpatient Encounter Medications as of 02/25/2020  Medication Sig  . ALPRAZolam (XANAX) 0.25 MG tablet Take 1 tablet (0.25 mg total) by mouth 2 (two) times daily as needed for anxiety.  Marland Kitchen atorvastatin (LIPITOR) 40 MG tablet Take 1 tablet (40 mg total) by mouth daily at 6 PM.  . fenofibrate (TRICOR) 145 MG tablet Take 1 tablet (145 mg total) by mouth daily.  Marland Kitchen lisinopril-hydrochlorothiazide (ZESTORETIC) 20-12.5 MG tablet Take 1 tablet by mouth daily.  . QUEtiapine (SEROQUEL) 200 MG tablet Take 1 tablet (200 mg total) by mouth at bedtime.  Marland Kitchen venlafaxine XR  (EFFEXOR-XR) 75 MG 24 hr capsule Take 1 capsule (75 mg total) by mouth daily with breakfast.  . [DISCONTINUED] metroNIDAZOLE (METROGEL) 0.75 % vaginal gel Place vaginally at bedtime.   No facility-administered encounter medications on file as of 02/25/2020.    Past Surgical History:  Procedure Laterality Date  . ABDOMINAL HYSTERECTOMY     partial, rt bso  . APPENDECTOMY    . BREAST SURGERY    . EYE SURGERY    . NASAL SEPTUM SURGERY    . THYROIDECTOMY, PARTIAL      Family History  Problem Relation Age of Onset  . Hypertension Mother   . Hyperlipidemia Father   . Hypertension Father     New complaints: None today  Social history: Lives with boyfriend  Controlled substance contract: n/a    Review of Systems  Constitutional: Negative for diaphoresis.  Eyes: Negative for pain.  Respiratory: Negative for shortness of breath.   Cardiovascular: Negative for chest pain, palpitations and leg swelling.  Gastrointestinal: Negative for abdominal pain.  Endocrine: Negative for polydipsia.  Skin: Negative for rash.  Neurological: Negative for dizziness, weakness and headaches.  Hematological: Does not bruise/bleed easily.  All other systems reviewed and are negative.      Objective:   Physical Exam Vitals and nursing note  reviewed.  Constitutional:      General: She is not in acute distress.    Appearance: Normal appearance. She is well-developed.  HENT:     Head: Normocephalic.     Nose: Nose normal.  Eyes:     Pupils: Pupils are equal, round, and reactive to light.  Neck:     Vascular: No carotid bruit or JVD.  Cardiovascular:     Rate and Rhythm: Normal rate and regular rhythm.     Heart sounds: Normal heart sounds.  Pulmonary:     Effort: Pulmonary effort is normal. No respiratory distress.     Breath sounds: Normal breath sounds. No wheezing or rales.  Chest:     Chest wall: No tenderness.  Abdominal:     General: Bowel sounds are normal. There is no  distension or abdominal bruit.     Palpations: Abdomen is soft. There is no hepatomegaly, splenomegaly, mass or pulsatile mass.     Tenderness: There is no abdominal tenderness.  Musculoskeletal:        General: Normal range of motion.     Cervical back: Normal range of motion and neck supple.  Lymphadenopathy:     Cervical: No cervical adenopathy.  Skin:    General: Skin is warm and dry.  Neurological:     Mental Status: She is alert and oriented to person, place, and time.     Deep Tendon Reflexes: Reflexes are normal and symmetric.  Psychiatric:        Behavior: Behavior normal.        Thought Content: Thought content normal.        Judgment: Judgment normal.    BP 117/73   Pulse (!) 104   Temp 98.2 F (36.8 C) (Temporal)   Ht 5\' 2"  (1.575 m)   Wt 131 lb 6 oz (59.6 kg)   BMI 24.03 kg/m         Assessment & Plan:  TONICA BRASINGTON comes in today with chief complaint of Medical Management of Chronic Issues   Diagnosis and orders addressed:  1. Essential hypertension Low odium diet - lisinopril-hydrochlorothiazide (ZESTORETIC) 20-12.5 MG tablet; Take 1 tablet by mouth daily.  Dispense: 90 tablet; Refill: 1  2. Hyperlipidemia with target LDL less than 100 Low fat diet - atorvastatin (LIPITOR) 40 MG tablet; Take 1 tablet (40 mg total) by mouth daily at 6 PM.  Dispense: 90 tablet; Refill: 1 - fenofibrate (TRICOR) 145 MG tablet; Take 1 tablet (145 mg total) by mouth daily.  Dispense: 90 tablet; Refill: 1  3. Severe episode of recurrent major depressive disorder, without psychotic features ([place]) stres management - venlafaxine XR (EFFEXOR-XR) 75 MG 24 hr capsule; Take 1 capsule (75 mg total) by mouth daily with breakfast.  Dispense: 90 capsule; Refill: 1  4. Bipolar 1 disorder (HCC) - QUEtiapine (SEROQUEL) 200 MG tablet; Take 1 tablet (200 mg total) by mouth at bedtime.  Dispense: 90 tablet; Refill: 1 - ALPRAZolam (XANAX) 0.25 MG tablet; Take 1 tablet (0.25 mg total)  by mouth 2 (two) times daily as needed for anxiety.  Dispense: 40 tablet; Refill: 1   Labs pending Health Maintenance reviewed Diet and exercise encouraged  Follow up plan: 6 months   Mary-Margaret Hassell Done, FNP

## 2020-02-25 NOTE — Patient Instructions (Signed)
Bell Palsy, Adult  Bell palsy is a short-term inability to move muscles in part of the face. The inability to move (paralysis) results from inflammation or compression of the facial nerve, which travels along the skull and under the ear to the side of the face (7th cranial nerve). This nerve is responsible for facial movements that include blinking, closing the eyes, smiling, and frowning. What are the causes? The exact cause of this condition is not known. It may be caused by an infection from a virus, such as the chickenpox (herpes zoster), Epstein-Barr, or mumps virus. What increases the risk? You are more likely to develop this condition if:  You are pregnant.  You have diabetes.  You have had a recent infection in your nose, throat, or airways (upper respiratory infection).  You have a weakened body defense system (immune system).  You have had a facial injury, such as a fracture.  You have a family history of Bell palsy. What are the signs or symptoms? Symptoms of this condition include:  Weakness on one side of the face.  Drooping eyelid and corner of the mouth.  Excessive tearing in one eye.  Difficulty closing the eyelid.  Dry eye.  Drooling.  Dry mouth.  Changes in taste.  Change in facial appearance.  Pain behind one ear.  Ringing in one or both ears.  Sensitivity to sound in one ear.  Facial twitching.  Headache.  Impaired speech.  Dizziness.  Difficulty eating or drinking. Most of the time, only one side of the face is affected. Rarely, Bell palsy affects the whole face. How is this diagnosed? This condition is diagnosed based on:  Your symptoms.  Your medical history.  A physical exam. You may also have to see health care providers who specialize in disorders of the nerves (neurologist) or diseases and conditions of the eye (ophthalmologist). You may have tests, such as:  A test to check for nerve damage (electromyogram).  Imaging  studies, such as CT or MRI scans.  Blood tests. How is this treated? This condition affects every person differently. Sometimes symptoms go away without treatment within a couple weeks. If treatment is needed, it varies from person to person. The goal of treatment is to reduce inflammation and protect the eye from damage. Treatment for Bell palsy may include:  Medicines, such as: ? Steroids to reduce swelling and inflammation. ? Antiviral drugs. ? Pain relievers, including aspirin, acetaminophen, or ibuprofen.  Eye drops or ointment to keep your eye moist.  Eye protection, if you cannot close your eye.  Exercises or massage to regain muscle strength and function (physical therapy). Follow these instructions at home:   Take over-the-counter and prescription medicines only as told by your health care provider.  If your eye is affected: ? Keep your eye moist with eye drops or ointment as told by your health care provider. ? Follow instructions for eye care and protection as told by your health care provider.  Do any physical therapy exercises as told by your health care provider.  Keep all follow-up visits as told by your health care provider. This is important. Contact a health care provider if:  You have a fever.  Your symptoms do not get better within 2-3 weeks, or your symptoms get worse.  Your eye is red, irritated, or painful.  You have new symptoms. Get help right away if:  You have weakness or numbness in a part of your body other than your face.  You have   trouble swallowing.  You develop neck pain or stiffness.  You develop dizziness or shortness of breath. Summary  Bell palsy is a short-term inability to move muscles in part of the face. The inability to move (paralysis) results from inflammation or compression of the facial nerve.  This condition affects every person differently. Sometimes symptoms go away without treatment within a couple weeks.  If  treatment is needed, it varies from person to person. The goal of treatment is to reduce inflammation and protect the eye from damage.  Contact your health care provider if your symptoms do not get better within 2-3 weeks, or your symptoms get worse. This information is not intended to replace advice given to you by your health care provider. Make sure you discuss any questions you have with your health care provider. Document Revised: 08/22/2017 Document Reviewed: 11/12/2016 Elsevier Patient Education  2020 [employer]  

## 2020-02-25 NOTE — Addendum Note (Signed)
Addended by: Bennie Pierini on: 02/25/2020 01:07 PM   Modules accepted: Orders

## 2020-02-26 LAB — CMP14+EGFR
ALT: 15 IU/L (ref 0–32)
AST: 27 IU/L (ref 0–40)
Albumin/Globulin Ratio: 1.6 (ref 1.2–2.2)
Albumin: 4.7 g/dL (ref 3.8–4.9)
Alkaline Phosphatase: 65 IU/L (ref 48–121)
BUN/Creatinine Ratio: 14 (ref 9–23)
BUN: 11 mg/dL (ref 6–24)
Bilirubin Total: 0.3 mg/dL (ref 0.0–1.2)
CO2: 22 mmol/L (ref 20–29)
Calcium: 9.7 mg/dL (ref 8.7–10.2)
Chloride: 100 mmol/L (ref 96–106)
Creatinine, Ser: 0.77 mg/dL (ref 0.57–1.00)
GFR calc Af Amer: 103 mL/min/{1.73_m2} (ref >59)
GFR calc non Af Amer: 90 mL/min/{1.73_m2} (ref >59)
Globulin, Total: 3 g/dL (ref 1.5–4.5)
Glucose: 169 mg/dL — ABNORMAL HIGH (ref 65–99)
Potassium: 3.9 mmol/L (ref 3.5–5.2)
Sodium: 136 mmol/L (ref 134–144)
Total Protein: 7.7 g/dL (ref 6.0–8.5)

## 2020-02-26 LAB — CBC WITH DIFFERENTIAL/PLATELET
Basophils Absolute: 0.1 10*3/uL (ref 0.0–0.2)
Basos: 1 %
EOS (ABSOLUTE): 0.7 10*3/uL — ABNORMAL HIGH (ref 0.0–0.4)
Eos: 8 %
Hematocrit: 42.6 % (ref 34.0–46.6)
Hemoglobin: 13.9 g/dL (ref 11.1–15.9)
Immature Grans (Abs): 0 10*3/uL (ref 0.0–0.1)
Immature Granulocytes: 0 %
Lymphocytes Absolute: 3.1 10*3/uL (ref 0.7–3.1)
Lymphs: 38 %
MCH: 29 pg (ref 26.6–33.0)
MCHC: 32.6 g/dL (ref 31.5–35.7)
MCV: 89 fL (ref 79–97)
Monocytes Absolute: 0.6 10*3/uL (ref 0.1–0.9)
Monocytes: 8 %
Neutrophils Absolute: 3.6 10*3/uL (ref 1.4–7.0)
Neutrophils: 45 %
Platelets: 312 10*3/uL (ref 150–450)
RBC: 4.8 x10E6/uL (ref 3.77–5.28)
RDW: 12.7 % (ref 11.7–15.4)
WBC: 8 10*3/uL (ref 3.4–10.8)

## 2020-02-26 LAB — LIPID PANEL
Chol/HDL Ratio: 16.8 ratio — ABNORMAL HIGH (ref 0.0–4.4)
Cholesterol, Total: 185 mg/dL (ref 100–199)
HDL: 11 mg/dL — ABNORMAL LOW (ref >39)
LDL Chol Calc (NIH): 129 mg/dL — ABNORMAL HIGH (ref 0–99)
Triglycerides: 249 mg/dL — ABNORMAL HIGH (ref 0–149)
VLDL Cholesterol Cal: 45 mg/dL — ABNORMAL HIGH (ref 5–40)

## 2020-09-27 ENCOUNTER — Other Ambulatory Visit: Payer: Self-pay | Admitting: Nurse Practitioner

## 2020-09-27 DIAGNOSIS — E785 Hyperlipidemia, unspecified: Secondary | ICD-10-CM

## 2020-09-28 ENCOUNTER — Telehealth: Payer: Self-pay | Admitting: Nurse Practitioner

## 2020-09-28 DIAGNOSIS — E785 Hyperlipidemia, unspecified: Secondary | ICD-10-CM

## 2020-09-28 MED ORDER — FENOFIBRATE 145 MG PO TABS: 145.0000 mg | ORAL_TABLET | Freq: Every day | ORAL | 0 refills | Status: DC

## 2020-09-28 MED ORDER — ATORVASTATIN CALCIUM 40 MG PO TABS: 40.0000 mg | ORAL_TABLET | Freq: Every day | ORAL | 0 refills | Status: DC

## 2020-09-28 NOTE — Telephone Encounter (Signed)
Pt aware refill sent to [facility]  

## 2020-09-28 NOTE — Telephone Encounter (Signed)
  Prescription Request  09/28/2020  What is the name of the medication or equipment? Lipitor and fenofibrate  Have you contacted your pharmacy to request a refill? (if applicable) yes  Which pharmacy would you like this sent to? [facility]   Patient notified that their request is being sent to the clinical staff for review and that they should receive a response within 2 business days.

## 2020-10-26 ENCOUNTER — Ambulatory Visit (INDEPENDENT_AMBULATORY_CARE_PROVIDER_SITE_OTHER): Payer: Self-pay | Admitting: Nurse Practitioner

## 2020-10-26 ENCOUNTER — Other Ambulatory Visit: Payer: Self-pay

## 2020-10-26 ENCOUNTER — Encounter: Payer: Self-pay | Admitting: Nurse Practitioner

## 2020-10-26 VITALS — BP 109/66 | HR 86 | Temp 97.7°F | Ht 62.0 in | Wt 137.2 lb

## 2020-10-26 DIAGNOSIS — E785 Hyperlipidemia, unspecified: Secondary | ICD-10-CM

## 2020-10-26 DIAGNOSIS — I10 Essential (primary) hypertension: Secondary | ICD-10-CM

## 2020-10-26 DIAGNOSIS — F319 Bipolar disorder, unspecified: Secondary | ICD-10-CM

## 2020-10-26 DIAGNOSIS — F332 Major depressive disorder, recurrent severe without psychotic features: Secondary | ICD-10-CM

## 2020-10-26 MED ORDER — ALPRAZOLAM 0.25 MG PO TABS: 0.2500 mg | ORAL_TABLET | Freq: Two times a day (BID) | ORAL | 1 refills | Status: DC | PRN

## 2020-10-26 MED ORDER — QUETIAPINE FUMARATE 200 MG PO TABS: 200.0000 mg | ORAL_TABLET | Freq: Every day | ORAL | 1 refills | Status: DC

## 2020-10-26 MED ORDER — VENLAFAXINE HCL ER 75 MG PO CP24
75.0000 mg | ORAL_CAPSULE | Freq: Every day | ORAL | 1 refills | Status: DC
Start: 2020-10-26 — End: 2021-06-28

## 2020-10-26 MED ORDER — ATORVASTATIN CALCIUM 40 MG PO TABS
40.0000 mg | ORAL_TABLET | Freq: Every day | ORAL | 1 refills | Status: DC
Start: 2020-10-26 — End: 2021-06-28

## 2020-10-26 MED ORDER — FENOFIBRATE 145 MG PO TABS: 145.0000 mg | ORAL_TABLET | Freq: Every day | ORAL | 1 refills | Status: DC

## 2020-10-26 MED ORDER — LISINOPRIL-HYDROCHLOROTHIAZIDE 20-12.5 MG PO TABS: 1.0000 | ORAL_TABLET | Freq: Every day | ORAL | 1 refills | Status: DC

## 2020-10-26 NOTE — Progress Notes (Signed)
Subjective:    Patient ID: Lindsay Dickerson, female    DOB: [birth year], 50 y.o.   MRN: [id number]   Chief Complaint: medical management of chronic issues     HPI:  1. Primary hypertension No c/o chest pain, sob or headache. Does not check blood pressure at home. BP Readings from Last 3 Encounters:  02/25/20 117/73  12/03/18 118/75  06/04/18 124/75     2. Hyperlipidemia with target LDL less than 100 Doe snot watch diet and does no dedicated exercise. She is currently on lipitor Lab Results  Component Value Date   CHOL 185 02/25/2020   HDL 11 (L) 02/25/2020   LDLCALC 129 (H) 02/25/2020   TRIG 249 (H) 02/25/2020   CHOLHDL 16.8 (H) 02/25/2020      3. Bipolar 1 disorder ([place]) Is on seroquel and xanax. She only takes xanax as needed. 1-2 times a month. She says she is doing well. Depression screen [facility] 2/9 10/26/2020 02/25/2020 06/30/2019  Decreased Interest 0 0 0  Down, Depressed, Hopeless 0 0 0  PHQ - 2 Score 0 0 0  Altered sleeping - 2 -  Tired, decreased energy - 1 -  Change in appetite - 0 -  Feeling bad or failure about yourself  - 0 -  Trouble concentrating - 0 -  Moving slowly or fidgety/restless - 0 -  Suicidal thoughts - 0 -  PHQ-9 Score - 3 -  Difficult doing work/chores - Not difficult at all -     4. Severe episode of recurrent major depressive disorder, without psychotic features ([place]) See above. She is on effexor and says she is doing well.    Outpatient Encounter Medications as of 10/26/2020  Medication Sig  . ALPRAZolam (XANAX) 0.25 MG tablet Take 1 tablet (0.25 mg total) by mouth 2 (two) times daily as needed for anxiety.  Marland Kitchen atorvastatin (LIPITOR) 40 MG tablet Take 1 tablet (40 mg total) by mouth daily at 6 PM.  . fenofibrate (TRICOR) 145 MG tablet Take 1 tablet (145 mg total) by mouth daily.  Marland Kitchen lisinopril-hydrochlorothiazide (ZESTORETIC) 20-12.5 MG tablet Take 1 tablet by mouth daily.  . QUEtiapine (SEROQUEL) 200 MG tablet Take 1 tablet (200 mg  total) by mouth at bedtime.  Marland Kitchen venlafaxine XR (EFFEXOR-XR) 75 MG 24 hr capsule Take 1 capsule (75 mg total) by mouth daily with breakfast.     Past Surgical History:  Procedure Laterality Date  . ABDOMINAL HYSTERECTOMY     partial, rt bso  . APPENDECTOMY    . BREAST SURGERY    . EYE SURGERY    . NASAL SEPTUM SURGERY    . THYROIDECTOMY, PARTIAL      Family History  Problem Relation Age of Onset  . Hypertension Mother   . Hyperlipidemia Father   . Hypertension Father     New complaints: None today  Social history: Lives with her boyfriend  Controlled substance contract: n/a    Review of Systems  Constitutional: Negative for diaphoresis.  Eyes: Negative for pain.  Respiratory: Negative for shortness of breath.   Cardiovascular: Negative for chest pain, palpitations and leg swelling.  Gastrointestinal: Negative for abdominal pain.  Endocrine: Negative for polydipsia.  Skin: Negative for rash.  Neurological: Negative for dizziness, weakness and headaches.  Hematological: Does not bruise/bleed easily.  All other systems reviewed and are negative.      Objective:   Physical Exam Vitals and nursing note reviewed.  Constitutional:      General: She is  not in acute distress.    Appearance: Normal appearance. She is well-developed and well-nourished.  HENT:     Head: Normocephalic.     Nose: Nose normal.     Mouth/Throat:     Mouth: Oropharynx is clear and moist.  Eyes:     Extraocular Movements: EOM normal.     Pupils: Pupils are equal, round, and reactive to light.  Neck:     Vascular: No carotid bruit or JVD.  Cardiovascular:     Rate and Rhythm: Normal rate and regular rhythm.     Pulses: Intact distal pulses.     Heart sounds: Normal heart sounds.  Pulmonary:     Effort: Pulmonary effort is normal. No respiratory distress.     Breath sounds: Normal breath sounds. No wheezing or rales.  Chest:     Chest wall: No tenderness.  Abdominal:     General:  Bowel sounds are normal. There is no distension or abdominal bruit. Aorta is normal.     Palpations: Abdomen is soft. There is no hepatomegaly, splenomegaly, mass or pulsatile mass.     Tenderness: There is no abdominal tenderness.  Musculoskeletal:        General: No edema. Normal range of motion.     Cervical back: Normal range of motion and neck supple.  Lymphadenopathy:     Cervical: No cervical adenopathy.  Skin:    General: Skin is warm and dry.  Neurological:     Mental Status: She is alert and oriented to person, place, and time.     Deep Tendon Reflexes: Reflexes are normal and symmetric.  Psychiatric:        Mood and Affect: Mood and affect normal.        Behavior: Behavior normal.        Thought Content: Thought content normal.        Judgment: Judgment normal.    BP 109/66   Pulse 86   Temp 97.7 F (36.5 C) (Temporal)   Ht $R'5\' 2"'Kp$  (1.575 m)   Wt 137 lb 3.2 oz (62.2 kg)   BMI 25.09 kg/m         Assessment & Plan:  Lindsay Dickerson comes in today with chief complaint of Medical Management of Chronic Issues   Diagnosis and orders addressed:  1. Primary hypertension Low sodium diet - CBC with Differential/Platelet - CMP14+EGFR - lisinopril-hydrochlorothiazide (ZESTORETIC) 20-12.5 MG tablet; Take 1 tablet by mouth daily.  Dispense: 90 tablet; Refill: 1  2. Hyperlipidemia with target LDL less than 100 Low fat diet - atorvastatin (LIPITOR) 40 MG tablet; Take 1 tablet (40 mg total) by mouth daily at 6 PM.  Dispense: 90 tablet; Refill: 1 - fenofibrate (TRICOR) 145 MG tablet; Take 1 tablet (145 mg total) by mouth daily.  Dispense: 90 tablet; Refill: 1 - Lipid panel  3. Bipolar 1 disorder ([facility]) Stress management - QUEtiapine (SEROQUEL) 200 MG tablet; Take 1 tablet (200 mg total) by mouth at bedtime.  Dispense: 90 tablet; Refill: 1 - ALPRAZolam (XANAX) 0.25 MG tablet; Take 1 tablet (0.25 mg total) by mouth 2 (two) times daily as needed for anxiety.  Dispense: 40  tablet; Refill: 1  4. Severe episode of recurrent major depressive disorder, without psychotic features ([facility]) - venlafaxine XR (EFFEXOR-XR) 75 MG 24 hr capsule; Take 1 capsule (75 mg total) by mouth daily with breakfast.  Dispense: 90 capsule; Refill: 1    Labs pending Health Maintenance reviewed Diet and exercise encouraged  Follow up  plan: 6 months   Mary-Margaret Hassell Done, FNP

## 2020-10-26 NOTE — Addendum Note (Signed)
Addended by: Bennie Pierini on: 10/26/2020 12:55 PM   Modules accepted: Level of Service

## 2021-05-01 ENCOUNTER — Encounter: Payer: Self-pay | Admitting: Nurse Practitioner

## 2021-05-01 ENCOUNTER — Ambulatory Visit: Payer: Self-pay | Admitting: Nurse Practitioner

## 2021-06-06 ENCOUNTER — Other Ambulatory Visit: Payer: Self-pay | Admitting: Nurse Practitioner

## 2021-06-06 DIAGNOSIS — I10 Essential (primary) hypertension: Secondary | ICD-10-CM

## 2021-06-28 ENCOUNTER — Other Ambulatory Visit: Payer: Self-pay

## 2021-06-28 ENCOUNTER — Ambulatory Visit (INDEPENDENT_AMBULATORY_CARE_PROVIDER_SITE_OTHER): Payer: Self-pay | Admitting: Nurse Practitioner

## 2021-06-28 ENCOUNTER — Encounter: Payer: Self-pay | Admitting: Nurse Practitioner

## 2021-06-28 VITALS — BP 123/79 | HR 85 | Temp 97.6°F | Resp 20 | Ht 62.0 in | Wt 135.0 lb

## 2021-06-28 DIAGNOSIS — E785 Hyperlipidemia, unspecified: Secondary | ICD-10-CM

## 2021-06-28 DIAGNOSIS — I10 Essential (primary) hypertension: Secondary | ICD-10-CM

## 2021-06-28 DIAGNOSIS — F332 Major depressive disorder, recurrent severe without psychotic features: Secondary | ICD-10-CM

## 2021-06-28 DIAGNOSIS — F319 Bipolar disorder, unspecified: Secondary | ICD-10-CM

## 2021-06-28 MED ORDER — ATORVASTATIN CALCIUM 40 MG PO TABS: 40.0000 mg | ORAL_TABLET | Freq: Every day | ORAL | 1 refills | Status: DC

## 2021-06-28 MED ORDER — FENOFIBRATE 145 MG PO TABS: 145.0000 mg | ORAL_TABLET | Freq: Every day | ORAL | 1 refills | Status: DC

## 2021-06-28 MED ORDER — ALPRAZOLAM 0.25 MG PO TABS: 0.2500 mg | ORAL_TABLET | Freq: Two times a day (BID) | ORAL | 5 refills | Status: DC | PRN

## 2021-06-28 MED ORDER — QUETIAPINE FUMARATE 200 MG PO TABS: 200.0000 mg | ORAL_TABLET | Freq: Every day | ORAL | 1 refills | Status: DC

## 2021-06-28 MED ORDER — LISINOPRIL-HYDROCHLOROTHIAZIDE 20-12.5 MG PO TABS: 1.0000 | ORAL_TABLET | Freq: Every day | ORAL | 1 refills | Status: DC

## 2021-06-28 MED ORDER — VENLAFAXINE HCL ER 75 MG PO CP24: 75.0000 mg | ORAL_CAPSULE | Freq: Every day | ORAL | 1 refills | Status: DC

## 2021-06-28 NOTE — Progress Notes (Signed)
Subjective:    Patient ID: Lindsay Dickerson, female    DOB: 05/04/1971, 50 y.o.   MRN: [id number]  Chief Complaint: Medical Management of Chronic Issues    HPI:  1. Primary hypertension No c/o chest pain, sob or headache. Does not check blood pressure at home. BP Readings from Last 3 Encounters:  06/28/21 123/79  10/26/20 109/66  02/25/20 117/73     2. Hyperlipidemia with target LDL less than 100 Watches diet but does little exercise. Lab Results  Component Value Date   CHOL 185 02/25/2020   HDL 11 (L) 02/25/2020   LDLCALC 129 (H) 02/25/2020   TRIG 249 (H) 02/25/2020   CHOLHDL 16.8 (H) 02/25/2020     3. Severe episode of recurrent major depressive disorder, without psychotic features ([place]) Is on effexor and is doing well. Depression screen [facility] 2/9 06/28/2021 10/26/2020 02/25/2020  Decreased Interest 2 0 0  Down, Depressed, Hopeless 2 0 0  PHQ - 2 Score 4 0 0  Altered sleeping 3 - 2  Tired, decreased energy 3 - 1  Change in appetite 1 - 0  Feeling bad or failure about yourself  2 - 0  Trouble concentrating 2 - 0  Moving slowly or fidgety/restless 2 - 0  Suicidal thoughts 0 - 0  PHQ-9 Score 17 - 3  Difficult doing work/chores Somewhat difficult - Not difficult at all  Some recent data might be hidden     4. Bipolar 1 disorder ([place]) Is on combination of seroquel and xanax. Is doing well.  GAD 7 : Generalized Anxiety Score 06/28/2021 02/25/2020  Nervous, Anxious, on Edge 2 0  Control/stop worrying 2 0  Worry too much - different things 2 0  Trouble relaxing 2 1  Restless 2 0  Easily annoyed or irritable 3 0  Afraid - awful might happen 2 0  Total GAD 7 Score 15 1  Anxiety Difficulty Somewhat difficult Not difficult at all        Outpatient Encounter Medications as of 06/28/2021  Medication Sig   ALPRAZolam (XANAX) 0.25 MG tablet Take 1 tablet (0.25 mg total) by mouth 2 (two) times daily as needed for anxiety.   atorvastatin (LIPITOR) 40 MG tablet Take 1  tablet (40 mg total) by mouth daily at 6 PM.   fenofibrate (TRICOR) 145 MG tablet Take 1 tablet (145 mg total) by mouth daily.   lisinopril-hydrochlorothiazide (ZESTORETIC) 20-12.5 MG tablet Take 1 tablet by mouth daily. (NEEDS TO BE SEEN BEFORE NEXT REFILL)   QUEtiapine (SEROQUEL) 200 MG tablet Take 1 tablet (200 mg total) by mouth at bedtime.   venlafaxine XR (EFFEXOR-XR) 75 MG 24 hr capsule Take 1 capsule (75 mg total) by mouth daily with breakfast.   No facility-administered encounter medications on file as of 06/28/2021.    Past Surgical History:  Procedure Laterality Date   ABDOMINAL HYSTERECTOMY     partial, rt bso   APPENDECTOMY     BREAST SURGERY     EYE SURGERY     NASAL SEPTUM SURGERY     THYROIDECTOMY, PARTIAL      Family History  Problem Relation Age of Onset   Hypertension Mother    Hyperlipidemia Father    Hypertension Father     New complaints: None today  Social history: Lives with boyfriend, daughter and step children  Controlled substance contract: n/a     Review of Systems  Constitutional:  Negative for diaphoresis.  Eyes:  Negative for pain.  Respiratory:  Negative for shortness of breath.   Cardiovascular:  Negative for chest pain, palpitations and leg swelling.  Gastrointestinal:  Negative for abdominal pain.  Endocrine: Negative for polydipsia.  Skin:  Negative for rash.  Neurological:  Negative for dizziness, weakness and headaches.  Hematological:  Does not bruise/bleed easily.  All other systems reviewed and are negative.     Objective:   Physical Exam Vitals and nursing note reviewed.  Constitutional:      General: She is not in acute distress.    Appearance: Normal appearance. She is well-developed.  HENT:     Head: Normocephalic.     Right Ear: Tympanic membrane normal.     Left Ear: Tympanic membrane normal.     Nose: Nose normal.     Mouth/Throat:     Mouth: Mucous membranes are moist.  Eyes:     Pupils: Pupils are equal,  round, and reactive to light.  Neck:     Vascular: No carotid bruit or JVD.  Cardiovascular:     Rate and Rhythm: Normal rate and regular rhythm.     Heart sounds: Normal heart sounds.  Pulmonary:     Effort: Pulmonary effort is normal. No respiratory distress.     Breath sounds: Normal breath sounds. No wheezing or rales.  Chest:     Chest wall: No tenderness.  Abdominal:     General: Bowel sounds are normal. There is no distension or abdominal bruit.     Palpations: Abdomen is soft. There is no hepatomegaly, splenomegaly, mass or pulsatile mass.     Tenderness: There is no abdominal tenderness.  Musculoskeletal:        General: Normal range of motion.     Cervical back: Normal range of motion and neck supple.  Lymphadenopathy:     Cervical: No cervical adenopathy.  Skin:    General: Skin is warm and dry.  Neurological:     Mental Status: She is alert and oriented to person, place, and time.     Deep Tendon Reflexes: Reflexes are normal and symmetric.  Psychiatric:        Behavior: Behavior normal.        Thought Content: Thought content normal.        Judgment: Judgment normal.    BP 123/79   Pulse 85   Temp 97.6 F (36.4 C) (Temporal)   Resp 20   Ht 5' 2" (1.575 m)   Wt 135 lb (61.2 kg)   SpO2 95%   BMI 24.69 kg/m        Assessment & Plan:  NORMAN BIER comes in today with chief complaint of Medical Management of Chronic Issues   Diagnosis and orders addressed:  1. Primary hypertension Low sodium diet - lisinopril-hydrochlorothiazide (ZESTORETIC) 20-12.5 MG tablet; Take 1 tablet by mouth daily. (NEEDS TO BE SEEN BEFORE NEXT REFILL)  Dispense: 90 tablet; Refill: 1 - CBC with Differential/Platelet - CMP14+EGFR  2. Hyperlipidemia with target LDL less than 100 Low fat diet - atorvastatin (LIPITOR) 40 MG tablet; Take 1 tablet (40 mg total) by mouth daily at 6 PM.  Dispense: 90 tablet; Refill: 1 - fenofibrate (TRICOR) 145 MG tablet; Take 1 tablet (145  mg total) by mouth daily.  Dispense: 90 tablet; Refill: 1 - Lipid panel  3. Severe episode of recurrent major depressive disorder, without psychotic features ([place]) Stress management - venlafaxine XR (EFFEXOR-XR) 75 MG 24 hr capsule; Take 1 capsule (75 mg total) by mouth daily with breakfast.  Dispense:  90 capsule; Refill: 1  4. Bipolar 1 disorder (HCC)  - QUEtiapine (SEROQUEL) 200 MG tablet; Take 1 tablet (200 mg total) by mouth at bedtime.  Dispense: 90 tablet; Refill: 1 - ALPRAZolam (XANAX) 0.25 MG tablet; Take 1 tablet (0.25 mg total) by mouth 2 (two) times daily as needed for anxiety.  Dispense: 60 tablet; Refill: 5   Labs pending Health Maintenance reviewed Diet and exercise encouraged  Follow up plan: 6 months   Mary-Margaret Hassell Done, FNP

## 2021-06-28 NOTE — Patient Instructions (Signed)
Stress, Adult Stress is a normal reaction to life events. Stress is what you feel when life demands more than you are used to, or more than you think you can handle. Some stress can be useful, such as studying for a test or meeting a deadline at work. Stress that occurs too often or for too long can cause problems. It can affect your emotional health and interfere with relationships and normal daily activities. Too much stress can weaken your body's defense system (immune system) and increase your risk for physical illness. If you already have a medical problem, stress can make it worse. What are the causes? All sorts of life events can cause stress. An event that causes stress for one person may not be stressful for another person. Major life events, whether positive or negative, commonly cause stress. Examples include: Losing a job or starting a new job. Losing a loved one. Moving to a new town or home. Getting married or divorced. Having a baby. Getting injured or sick. Less obvious life events can also cause stress, especially if they occur day after day or in combination with each other. Examples include: Working long hours. Driving in traffic. Caring for children. Being in debt. Being in a difficult relationship. What are the signs or symptoms? Stress can cause emotional symptoms, including: Anxiety. This is feeling worried, afraid, on edge, overwhelmed, or out of control. Anger, including irritation or impatience. Depression. This is feeling sad, down, helpless, or guilty. Trouble focusing, remembering, or making decisions. Stress can cause physical symptoms, including: Aches and pains. These may affect your head, neck, back, stomach, or other areas of your body. Tight muscles or a clenched jaw. Low energy. Trouble sleeping. Stress can cause unhealthy behaviors, including: Eating to feel better (overeating) or skipping meals. Working too much or putting off tasks. Smoking,  drinking alcohol, or using drugs to feel better. How is this diagnosed? Stress is diagnosed through an assessment by your health care provider. He or she may diagnose this condition based on: Your symptoms and any stressful life events. Your medical history. Tests to rule out other causes of your symptoms. Depending on your condition, your health care provider may refer you to a specialist for further evaluation. How is this treated? Stress management techniques are the recommended treatment for stress. Medicine is not typically recommended for the treatment of stress. Techniques to reduce your reaction to stressful life events include: Stress identification. Monitor yourself for symptoms of stress and identify what causes stress for you. These skills may help you to avoid or prepare for stressful events. Time management. Set your priorities, keep a calendar of events, and learn to say no. Taking these actions can help you avoid making too many commitments. Techniques for coping with stress include: Rethinking the problem. Try to think realistically about stressful events rather than ignoring them or overreacting. Try to find the positives in a stressful situation rather than focusing on the negatives. Exercise. Physical exercise can release both physical and emotional tension. The key is to find a form of exercise that you enjoy and do it regularly. Relaxation techniques. These relax the body and mind. The key is to find one or more that you enjoy and use the techniques regularly. Examples include: Meditation, deep breathing, or progressive relaxation techniques. Yoga or tai chi. Biofeedback, mindfulness techniques, or journaling. Listening to music, being out in nature, or participating in other hobbies. Practicing a healthy lifestyle. Eat a balanced diet, drink plenty of water, limit or  avoid caffeine, and get plenty of sleep. Having a strong support network. Spend time with family, friends,  or other people you enjoy being around. Express your feelings and talk things over with someone you trust. Counseling or talk therapy with a mental health professional may be helpful if you are having trouble managing stress on your own. Follow these instructions at home: Lifestyle  Avoid drugs. Do not use any products that contain nicotine or tobacco, such as cigarettes, e-cigarettes, and chewing tobacco. If you need help quitting, ask your health care provider. Limit alcohol intake to no more than 1 drink a day for nonpregnant women and 2 drinks a day for men. One drink equals 12 oz of beer, 5 oz of wine, or 1 oz of hard liquor Do not use alcohol or drugs to relax. Eat a balanced diet that includes fresh fruits and vegetables, whole grains, lean meats, fish, eggs, and beans, and low-fat dairy. Avoid processed foods and foods high in added fat, sugar, and salt. Exercise at least 30 minutes on 5 or more days each week. Get 7-8 hours of sleep each night. General instructions  Practice stress management techniques as discussed with your health care provider. Drink enough fluid to keep your urine clear or pale yellow. Take over-the-counter and prescription medicines only as told by your health care provider. Keep all follow-up visits as told by your health care provider. This is important. Contact a health care provider if: Your symptoms get worse. You have new symptoms. You feel overwhelmed by your problems and can no longer manage them on your own. Get help right away if: You have thoughts of hurting yourself or others. If you ever feel like you may hurt yourself or others, or have thoughts about taking your own life, get help right away. You can go to your nearest emergency department or call: Your local emergency services (911 in the [place] A suicide crisis helpline, such as the [place] at (410)025-6148. This is open 24 hours a day. Summary Stress is a  normal reaction to life events. It can cause problems if it happens too often or for too long. Practicing stress management techniques is the best way to treat stress. Counseling or talk therapy with a mental health professional may be helpful if you are having trouble managing stress on your own. This information is not intended to replace advice given to you by your health care provider. Make sure you discuss any questions you have with your health care provider. Document Revised: 11/17/2020 Document Reviewed: 05/26/2020 Elsevier Patient Education  2022 [employer].

## 2021-06-29 LAB — CBC WITH DIFFERENTIAL/PLATELET
Basophils Absolute: 0.1 10*3/uL (ref 0.0–0.2)
Basos: 1 %
EOS (ABSOLUTE): 0.6 10*3/uL — ABNORMAL HIGH (ref 0.0–0.4)
Eos: 8 %
Hematocrit: 40.3 % (ref 34.0–46.6)
Hemoglobin: 13.8 g/dL (ref 11.1–15.9)
Immature Grans (Abs): 0 10*3/uL (ref 0.0–0.1)
Immature Granulocytes: 0 %
Lymphocytes Absolute: 2.5 10*3/uL (ref 0.7–3.1)
Lymphs: 32 %
MCH: 30.1 pg (ref 26.6–33.0)
MCHC: 34.2 g/dL (ref 31.5–35.7)
MCV: 88 fL (ref 79–97)
Monocytes Absolute: 0.8 10*3/uL (ref 0.1–0.9)
Monocytes: 11 %
Neutrophils Absolute: 3.8 10*3/uL (ref 1.4–7.0)
Neutrophils: 48 %
Platelets: 342 10*3/uL (ref 150–450)
RBC: 4.58 x10E6/uL (ref 3.77–5.28)
RDW: 12.4 % (ref 11.7–15.4)
WBC: 7.9 10*3/uL (ref 3.4–10.8)

## 2021-06-29 LAB — LIPID PANEL
Chol/HDL Ratio: 11.5 ratio — ABNORMAL HIGH (ref 0.0–4.4)
Cholesterol, Total: 126 mg/dL (ref 100–199)
HDL: 11 mg/dL — ABNORMAL LOW (ref >39)
LDL Chol Calc (NIH): 84 mg/dL (ref 0–99)
Triglycerides: 176 mg/dL — ABNORMAL HIGH (ref 0–149)
VLDL Cholesterol Cal: 31 mg/dL (ref 5–40)

## 2021-06-29 LAB — CMP14+EGFR
ALT: 14 IU/L (ref 0–32)
AST: 23 IU/L (ref 0–40)
Albumin/Globulin Ratio: 1.6 (ref 1.2–2.2)
Albumin: 4.6 g/dL (ref 3.8–4.9)
Alkaline Phosphatase: 65 IU/L (ref 44–121)
BUN/Creatinine Ratio: 12 (ref 9–23)
BUN: 9 mg/dL (ref 6–24)
Bilirubin Total: 0.4 mg/dL (ref 0.0–1.2)
CO2: 23 mmol/L (ref 20–29)
Calcium: 9.7 mg/dL (ref 8.7–10.2)
Chloride: 100 mmol/L (ref 96–106)
Creatinine, Ser: 0.77 mg/dL (ref 0.57–1.00)
Globulin, Total: 2.9 g/dL (ref 1.5–4.5)
Glucose: 146 mg/dL — ABNORMAL HIGH (ref 70–99)
Potassium: 4.8 mmol/L (ref 3.5–5.2)
Sodium: 138 mmol/L (ref 134–144)
Total Protein: 7.5 g/dL (ref 6.0–8.5)
eGFR: 93 mL/min/{1.73_m2} (ref >59)

## 2021-07-02 ENCOUNTER — Other Ambulatory Visit: Payer: Self-pay | Admitting: Nurse Practitioner

## 2021-07-02 DIAGNOSIS — Z1231 Encounter for screening mammogram for malignant neoplasm of breast: Secondary | ICD-10-CM

## 2021-08-24 ENCOUNTER — Inpatient Hospital Stay: Admission: RE | Admit: 2021-08-24 | Payer: Self-pay | Source: Ambulatory Visit

## 2021-11-01 ENCOUNTER — Telehealth: Payer: Self-pay | Admitting: Nurse Practitioner

## 2021-11-01 ENCOUNTER — Ambulatory Visit (INDEPENDENT_AMBULATORY_CARE_PROVIDER_SITE_OTHER): Payer: Self-pay | Admitting: Family

## 2021-11-01 ENCOUNTER — Encounter: Payer: Self-pay | Admitting: Family

## 2021-11-01 DIAGNOSIS — Z2089 Contact with and (suspected) exposure to other communicable diseases: Secondary | ICD-10-CM

## 2021-11-01 MED ORDER — CIPROFLOXACIN HCL 500 MG PO TABS: 500.0000 mg | ORAL_TABLET | Freq: Once | ORAL | 0 refills | Status: AC

## 2021-11-01 NOTE — Patient Instructions (Signed)
Meningococcal Meningitis °  °Meningococcal meningitis is an inflammation of the lining (meninges) around the brain and spinal cord. It is caused by a type of bacteria that can also infect the bloodstream. This bacteria can spread from person to person (is contagious) through close contact. °Meningococcal meningitis is a medical emergency. It can be life-threatening if it is not treated quickly with antibiotic medicines. Complications can include hearing loss and brain damage. Most cases of meningococcal meningitis can be prevented with a vaccination. °What are the causes? °This condition is caused by meningococcus bacteria (Neisseria meningitidis). Some people normally have meningococcus bacteria in their nose or throat. For some people, the bacteria do not cause problems. For others, the bacteria cause meningococcal meningitis. It is not known why some people who carry the bacteria get the disease and others do not. °You can get the disease if fluid from an infected person's nose, mouth, or throat gets into your body and travels to your brain. It is spread by close contact with someone who is infected with the bacteria, such as living with or kissing an infected person. °Most often, this condition spreads when an infected person coughs or sneezes. °It usually spreads only to people who are in close contact with the infected person for long periods of time, such as family members or roommates. °What increases the risk? °You are more likely to develop this condition if you: °Are young. Children, teens, and young adults are more likely to get this condition than adults. °Live close to many other people, such as in a dormitory or military housing. °Have a medical condition or take certain medicines that lower the body's ability to fight infection. °What are the signs or symptoms? °Symptoms of this condition may develop suddenly. They may include: °High fever. °Stiff neck. °Headache. °Sensitivity to  light. °Confusion. °Nausea and vomiting. °If the infection spreads to the blood (meningococcal septicemia), the following symptoms may develop: °Chills. °Tiredness (fatigue). °Muscle aches. °Nausea, vomiting, and diarrhea. °Rapid breathing. °Red spots or purple blotches on the skin. °Seizures. °How is this diagnosed? °This condition is diagnosed based on: °Your symptoms. °A physical exam. °Blood tests. °Lumbar puncture. A lumbar puncture is a procedure to remove a sample of cerebrospinal fluid, which is the fluid that surrounds the brain and spinal cord. The samples of cerebrospinal fluid are sent to a lab for testing to see if meningococcal bacteria will grow from them (cultures). °How is this treated? °This condition is treated in a hospital with antibiotics. °It is important to begin taking these medicines right away. °The antibiotics may be given through an IV that is inserted into one of your veins. °Most people receive IV antibiotics for about 1 week. °Depending on your condition, you may need other treatments, including: °Fluids given through an IV. °Oxygen. °Medicine to increase your blood pressure. °A machine to clean your blood (dialysis) if your kidneys are injured by the infection. °A machine to help you breathe (mechanical ventilation) if your lungs are injured by the infection. °Follow these instructions at home: °Medicines °Take your antibiotic medicine as told by your health care provider. Do not stop taking the antibiotic even if you start to feel better. This is important. °Take other over-the-counter and prescription medicines only as told by your health care provider. °General instructions °Tell everyone who you have had contact with recently that you have meningococcal meningitis. They may need to see a health care provider for treatment. Even people who do not get sick may need to   take an antibiotic that will lower their chance of getting infected. °Drink enough fluid to keep your urine pale  yellow. °Rest at home until you feel better. Return to your normal activities as told by your health care provider. °Keep all follow-up visits. This is important. °How is this prevented? ° °Talk with your health care provider about: °Getting the meningococcal vaccine to prevent meningitis. °Getting antibiotics if you have close contact with someone who has meningitis. °Avoid touching your hands to your face when you have not washed your hands recently. °Wash your hands often with soap and water for at least 20 seconds. If soap and water are not available, use alcohol-based hand sanitizer. °Avoid close contact with people who are sick. °Disinfect counters and other frequently touched surfaces if someone in your home is sick. °Stay home while you are sick, and try to stay away from others as much as possible to avoid spreading the infection. °Cover your nose and mouth when you sneeze or cough. °Where to find more information °Centers for Disease Control and Prevention: www.cdc.gov/meningococcal °Contact a health care provider if: °You have a fever that does not get better with medicine. °Get help right away if: °You have a fever along with one of the following: °Severe headache. °Stiff neck. °Confusion. °Vomiting. °You suddenly lose hearing or vision. °You have a seizure. °You have trouble breathing. °These symptoms may represent a serious problem that is an emergency. Do not wait to see if the symptoms will go away. Get medical help right away. Call your local emergency services (911 in the [place] Do not drive yourself to the hospital. °Summary °Meningococcal meningitis is an inflammation of the lining (meninges) around the brain and spinal cord. °Meningococcal meningitis is a medical emergency. It can be life-threatening if it is not treated quickly with antibiotic medicines. °You can get the disease if fluid from an infected person's nose, mouth, or throat gets into your body and travels to your brain. Symptoms of  the condition develop quickly. °Talk with your health care provider about getting the meningococcal vaccine to prevent meningitis. °This information is not intended to replace advice given to you by your health care provider. Make sure you discuss any questions you have with your health care provider. °Document Revised: 05/27/2020 Document Reviewed: 05/27/2020 °Elsevier Patient Education © 2022 Elsevier Inc. ° °

## 2021-11-01 NOTE — Telephone Encounter (Signed)
Patient said that she was exposed to meningitis last night. Offered appt and patient said she did not want. She wants to speak to MMM or her nurse to see what they would advise. Please call back.

## 2021-11-01 NOTE — Progress Notes (Signed)
Virtual Visit  Note Due to COVID-19 pandemic this visit was conducted virtually. This visit type was conducted due to national recommendations for restrictions regarding the COVID-19 Pandemic (e.g. social distancing, sheltering in place) in an effort to limit this patient's exposure and mitigate transmission in our community. All issues noted in this document were discussed and addressed.  A physical exam was not performed with this format.  I connected with Lindsay Dickerson on 11/01/21 at 1:50 pm  by telephone and verified that I am speaking with the correct person using two identifiers. Lindsay Dickerson is currently located at work and daughter is currently with her during visit. The provider, Lindsay Dun, FNP is located in their office at time of visit.  I discussed the limitations, risks, security and privacy concerns of performing an evaluation and management service by telephone and the availability of in person appointments. I also discussed with the patient that there may be a patient responsible charge related to this service. The patient expressed understanding and agreed to proceed.  Lindsay Dickerson, methot are scheduled for a virtual visit with your provider today.    Just as we do with appointments in the office, we must obtain your consent to participate.  Your consent will be active for this visit and any virtual visit you may have with one of our providers in the next 365 days.    If you have a MyChart account, I can also send a copy of this consent to you electronically.  All virtual visits are billed to your insurance company just like a traditional visit in the office.  As this is a virtual visit, video technology does not allow for your provider to perform a traditional examination.  This may limit your provider's ability to fully assess your condition.  If your provider identifies any concerns that need to be evaluated in person or the need to arrange testing such as labs, EKG,  etc, we will make arrangements to do so.    Although advances in technology are sophisticated, we cannot ensure that it will always work on either your end or our end.  If the connection with a video visit is poor, we may have to switch to a telephone visit.  With either a video or telephone visit, we are not always able to ensure that we have a secure connection.   I need to obtain your verbal consent now.   Are you willing to proceed with your visit today?   Lindsay Dickerson has provided verbal consent on 11/01/2021 for a virtual visit (video or telephone).   Lindsay Dickerson, [place] 11/01/2021  1:52 PM    History and Present Illness:  HPI  Pt calls the office today with exposure to bacterial meningitis. She reports her daughter was diagnosed this morning in ED and was admitted.   She reports she shares cigarettes and close exposure with her.  Denies any fever, headache, body aches, nausea, vomiting,  neck pain.  Review of Systems  All other systems reviewed and are negative.   Observations/Objective: No SOB or distress noted   Assessment and Plan: 1. Exposure to meningitis Start Cipro Report any fevers, headaches, neck pain, N&V, or flu like symptoms. Go to ED - ciprofloxacin (CIPRO) 500 MG tablet; Take 1 tablet (500 mg total) by mouth once for 1 dose.  Dispense: 1 tablet; Refill: 0    I discussed the assessment and treatment plan with the patient. The patient was provided an opportunity to ask  questions and all were answered. The patient agreed with the plan and demonstrated an understanding of the instructions.   The patient was advised to call back or seek an in-person evaluation if the symptoms worsen or if the condition fails to improve as anticipated.  The above assessment and management plan was discussed with the patient. The patient verbalized understanding of and has agreed to the management plan. Patient is aware to call the clinic if symptoms persist or worsen. Patient  is aware when to return to the clinic for a follow-up visit. Patient educated on when it is appropriate to go to the emergency department.   Time call ended:  2:01 pm   I provided 11 minutes of  non face-to-face time during this encounter.    Lindsay Dun, FNP

## 2021-11-01 NOTE — Telephone Encounter (Signed)
°  Called to schedule virtual

## 2021-12-27 ENCOUNTER — Ambulatory Visit (INDEPENDENT_AMBULATORY_CARE_PROVIDER_SITE_OTHER): Payer: Self-pay | Admitting: Nurse Practitioner

## 2021-12-27 ENCOUNTER — Encounter: Payer: Self-pay | Admitting: Nurse Practitioner

## 2021-12-27 VITALS — BP 105/70 | HR 81 | Temp 97.5°F | Resp 20 | Ht 62.0 in | Wt 126.0 lb

## 2021-12-27 DIAGNOSIS — E785 Hyperlipidemia, unspecified: Secondary | ICD-10-CM

## 2021-12-27 DIAGNOSIS — F332 Major depressive disorder, recurrent severe without psychotic features: Secondary | ICD-10-CM

## 2021-12-27 DIAGNOSIS — I10 Essential (primary) hypertension: Secondary | ICD-10-CM

## 2021-12-27 DIAGNOSIS — F319 Bipolar disorder, unspecified: Secondary | ICD-10-CM

## 2021-12-27 MED ORDER — VENLAFAXINE HCL ER 75 MG PO CP24: 75.0000 mg | ORAL_CAPSULE | Freq: Every day | ORAL | 1 refills | Status: DC

## 2021-12-27 MED ORDER — FENOFIBRATE 145 MG PO TABS: 145.0000 mg | ORAL_TABLET | Freq: Every day | ORAL | 1 refills | Status: DC

## 2021-12-27 MED ORDER — LISINOPRIL-HYDROCHLOROTHIAZIDE 20-12.5 MG PO TABS: 1.0000 | ORAL_TABLET | Freq: Every day | ORAL | 1 refills | Status: DC

## 2021-12-27 MED ORDER — QUETIAPINE FUMARATE 200 MG PO TABS: 200.0000 mg | ORAL_TABLET | Freq: Every day | ORAL | 1 refills | Status: DC

## 2021-12-27 MED ORDER — ATORVASTATIN CALCIUM 40 MG PO TABS: 40.0000 mg | ORAL_TABLET | Freq: Every day | ORAL | 1 refills | Status: DC

## 2021-12-27 NOTE — Patient Instructions (Signed)
Exercising to Stay Healthy °To become healthy and stay healthy, it is recommended that you do moderate-intensity and vigorous-intensity exercise. You can tell that you are exercising at a moderate intensity if your heart starts beating faster and you start breathing faster but can still hold a conversation. You can tell that you are exercising at a vigorous intensity if you are breathing much harder and faster and cannot hold a conversation while exercising. °How can exercise benefit me? °Exercising regularly is important. It has many health benefits, such as: °Improving overall fitness, flexibility, and endurance. °Increasing bone density. °Helping with weight control. °Decreasing body fat. °Increasing muscle strength and endurance. °Reducing stress and tension, anxiety, depression, or anger. °Improving overall health. °What guidelines should I follow while exercising? °Before you start a new exercise program, talk with your health care provider. °Do not exercise so much that you hurt yourself, feel dizzy, or get very short of breath. °Wear comfortable clothes and wear shoes with good support. °Drink plenty of water while you exercise to prevent dehydration or heat stroke. °Work out until your breathing and your heartbeat get faster (moderate intensity). °How often should I exercise? °Choose an activity that you enjoy, and set realistic goals. Your health care provider can help you make an activity plan that is individually designed and works best for you. °Exercise regularly as told by your health care provider. This may include: °Doing strength training two times a week, such as: °Lifting weights. °Using resistance bands. °Push-ups. °Sit-ups. °Yoga. °Doing a certain intensity of exercise for a given amount of time. Choose from these options: °A total of 150 minutes of moderate-intensity exercise every week. °A total of 75 minutes of vigorous-intensity exercise every week. °A mix of moderate-intensity and  vigorous-intensity exercise every week. °Children, pregnant women, people who have not exercised regularly, people who are overweight, and older adults may need to talk with a health care provider about what activities are safe to perform. If you have a medical condition, be sure to talk with your health care provider before you start a new exercise program. °What are some exercise ideas? °Moderate-intensity exercise ideas include: °Walking 1 mile (1.6 km) in about 15 minutes. °Biking. °Hiking. °Golfing. °Dancing. °Water aerobics. °Vigorous-intensity exercise ideas include: °Walking 4.5 miles (7.2 km) or more in about 1 hour. °Jogging or running 5 miles (8 km) in about 1 hour. °Biking 10 miles (16.1 km) or more in about 1 hour. °Lap swimming. °Roller-skating or in-line skating. °Cross-country skiing. °Vigorous competitive sports, such as football, basketball, and soccer. °Jumping rope. °Aerobic dancing. °What are some everyday activities that can help me get exercise? °Yard work, such as: °Pushing a lawn mower. °Raking and bagging leaves. °Washing your car. °Pushing a stroller. °Shoveling snow. °Gardening. °Washing windows or floors. °How can I be more active in my day-to-day activities? °Use stairs instead of an elevator. °Take a walk during your lunch break. °If you drive, park your car farther away from your work or school. °If you take public transportation, get off one stop early and walk the rest of the way. °Stand up or walk around during all of your indoor phone calls. °Get up, stretch, and walk around every 30 minutes throughout the day. °Enjoy exercise with a friend. Support to continue exercising will help you keep a regular routine of activity. °Where to find more information °You can find more information about exercising to stay healthy from: °[place] Department of Health and Human Services: www.hhs.gov °Centers for Disease Control and Prevention (  CDC): www.cdc.gov °Summary °Exercising regularly is  important. It will improve your overall fitness, flexibility, and endurance. °Regular exercise will also improve your overall health. It can help you control your weight, reduce stress, and improve your bone density. °Do not exercise so much that you hurt yourself, feel dizzy, or get very short of breath. °Before you start a new exercise program, talk with your health care provider. °This information is not intended to replace advice given to you by your health care provider. Make sure you discuss any questions you have with your health care provider. °Document Revised: 01/05/2021 Document Reviewed: 01/05/2021 °Elsevier Patient Education © 2022 Elsevier Inc. ° °

## 2021-12-27 NOTE — Progress Notes (Signed)
? ?Subjective:  ? ? Patient ID: Lindsay Dickerson, female    DOB: 10/04/1970, 51 y.o.   MRN: [id number] ? ? ?Chief Complaint: medical management of chronic issues  ?  ? ?HPI: ? ?Lindsay Dickerson is a 51 y.o. who identifies as a female who was assigned female at birth.  ? ?Social history: ?Lives with: her boyfriend ?Work history: works at  [employer] ? ? ?Comes in today for follow up of the following chronic medical issues: ? ?1. Primary hypertension ?No c/o chest pain, sob or headache.does not check blood pressure at home.] ?BP Readings from Last 3 Encounters:  ?06/28/21 123/79  ?10/26/20 109/66  ?02/25/20 117/73  ? ? ?2. Hyperlipidemia with target LDL less than 100 ?Does not really watch diet and little to no exercise. ?Lab Results  ?Component Value Date  ? CHOL 126 06/28/2021  ? HDL 11 (L) 06/28/2021  ? [place] 84 06/28/2021  ? TRIG 176 (H) 06/28/2021  ? CHOLHDL 11.5 (H) 06/28/2021  ? ?The ASCVD Risk score (Arnett DK, et al., 2019) failed to calculate for the following reasons: ?  The valid HDL cholesterol range is 20 to 100 mg/dL ?  The valid total cholesterol range is 130 to 320 mg/dL ? ? ?3. Bipolar 1 disorder ([place]) ?Is on seroquel and xanax and is doing well. ? ?  12/27/2021  ? 11:24 AM 06/28/2021  ?  8:43 AM 02/25/2020  ? 12:42 PM  ?GAD 7 : Generalized Anxiety Score  ?Nervous, Anxious, on Edge 1 2 0  ?Control/stop worrying 2 2 0  ?Worry too much - different things 2 2 0  ?Trouble relaxing 0 2 1  ?Restless 1 2 0  ?Easily annoyed or irritable 2 3 0  ?Afraid - awful might happen 0 2 0  ?Total GAD 7 Score 8 15 1   ?Anxiety Difficulty Somewhat difficult Somewhat difficult Not difficult at all  ? ? ? ? 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 ? ?  12/27/2021  ? 11:24 AM 06/28/2021  ?  8:42 AM 10/26/2020  ? 12:41 PM  ?Depression screen PHQ 2/9  ?Decreased Interest 1 2 0  ?Down, Depressed, Hopeless 1 2 0  ?PHQ - 2 Score 2 4 0  ?Altered  sleeping 0 3   ?Tired, decreased energy 3 3   ?Change in appetite 2 1   ?Feeling bad or failure about yourself  2 2   ?Trouble concentrating 1 2   ?Moving slowly or fidgety/restless 0 2   ?Suicidal thoughts 0 0   ?PHQ-9 Score 10 17   ?Difficult doing work/chores Somewhat difficult Somewhat difficult   ? ? ? ? ? ?New complaints: ?None today ? ?Allergies  ?Allergen Reactions  ? Erythromycin Anaphylaxis  ? Zithromax [Azithromycin] Anaphylaxis and Swelling  ?  Throat started closing up  ? Flagyl [Metronidazole]   ? Levaquin [Levofloxacin In D5w]   ? ?Outpatient Encounter Medications as of 12/27/2021  ?Medication Sig  ? ALPRAZolam (XANAX) 0.25 MG tablet Take 1 tablet (0.25 mg total) by mouth 2 (two) times daily as needed for anxiety.  ? atorvastatin (LIPITOR) 40 MG tablet Take 1 tablet (40 mg total) by mouth daily at 6 PM.  ? fenofibrate (TRICOR) 145 MG tablet Take 1 tablet (145 mg total) by mouth daily.  ? lisinopril-hydrochlorothiazide (ZESTORETIC) 20-12.5 MG tablet Take 1 tablet by mouth daily. (NEEDS TO BE SEEN BEFORE NEXT REFILL)  ? QUEtiapine (SEROQUEL)  200 MG tablet Take 1 tablet (200 mg total) by mouth at bedtime.  ? venlafaxine XR (EFFEXOR-XR) 75 MG 24 hr capsule Take 1 capsule (75 mg total) by mouth daily with breakfast.  ? ?No facility-administered encounter medications on file as of 12/27/2021.  ? ? ?Past Surgical History:  ?Procedure Laterality Date  ? ABDOMINAL HYSTERECTOMY    ? partial, rt bso  ? APPENDECTOMY    ? BREAST SURGERY    ? EYE SURGERY    ? NASAL SEPTUM SURGERY    ? THYROIDECTOMY, PARTIAL    ? ? ?Family History  ?Problem Relation Age of Onset  ? Hypertension Mother   ? Hyperlipidemia Father   ? Hypertension Father   ? ? ? ? ?Controlled substance contract: n/a ? ? ? ? ?Review of Systems  ?Constitutional:  Negative for diaphoresis.  ?Eyes:  Negative for pain.  ?Respiratory:  Negative for shortness of breath.   ?Cardiovascular:  Negative for chest pain, palpitations and leg swelling.  ?Gastrointestinal:   Negative for abdominal pain.  ?Endocrine: Negative for polydipsia.  ?Skin:  Negative for rash.  ?Neurological:  Negative for dizziness, weakness and headaches.  ?Hematological:  Does not bruise/bleed easily.  ?All other systems reviewed and are negative. ? ?   ?Objective:  ? Physical Exam ?Vitals and nursing note reviewed.  ?Constitutional:   ?   General: She is not in acute distress. ?   Appearance: Normal appearance. She is well-developed.  ?HENT:  ?   Head: Normocephalic.  ?   Right Ear: Tympanic membrane normal.  ?   Left Ear: Tympanic membrane normal.  ?   Nose: Nose normal.  ?   Mouth/Throat:  ?   Mouth: Mucous membranes are moist.  ?Eyes:  ?   Pupils: Pupils are equal, round, and reactive to light.  ?Neck:  ?   Vascular: No carotid bruit or JVD.  ?Cardiovascular:  ?   Rate and Rhythm: Normal rate and regular rhythm.  ?   Heart sounds: Normal heart sounds.  ?Pulmonary:  ?   Effort: Pulmonary effort is normal. No respiratory distress.  ?   Breath sounds: Normal breath sounds. No wheezing or rales.  ?Chest:  ?   Chest wall: No tenderness.  ?Abdominal:  ?   General: Bowel sounds are normal. There is no distension or abdominal bruit.  ?   Palpations: Abdomen is soft. There is no hepatomegaly, splenomegaly, mass or pulsatile mass.  ?   Tenderness: There is no abdominal tenderness.  ?Musculoskeletal:     ?   General: Normal range of motion.  ?   Cervical back: Normal range of motion and neck supple.  ?Lymphadenopathy:  ?   Cervical: No cervical adenopathy.  ?Skin: ?   General: Skin is warm and dry.  ?Neurological:  ?   Mental Status: She is alert and oriented to person, place, and time.  ?   Deep Tendon Reflexes: Reflexes are normal and symmetric.  ?Psychiatric:     ?   Behavior: Behavior normal.     ?   Thought Content: Thought content normal.     ?   Judgment: Judgment normal.  ? ?BP 105/70   Pulse 81   Temp (!) 97.5 ?F (36.4 ?C) (Temporal)   Resp 20   Ht 5' 2"  (1.575 m)   Wt 126 lb (57.2 kg)   SpO2 95%    BMI 23.05 kg/m?  ? ? ? ? ?   ?Assessment & Plan:  ? ?Lindsay Mola  KAYDON Dickerson comes in today with chief complaint of Medical Management of Chronic Issues ? ? ?Diagnosis and orders addressed: ? ?1. Primary hypertension ?Low sodium diet ?- lisinopril-hydrochlorothiazide (ZESTORETIC) 20-12.5 MG tablet; Take 1 tablet by mouth daily. (NEEDS TO BE SEEN BEFORE NEXT REFILL)  Dispense: 90 tablet; Refill: 1 ?- BMP8+EGFR ?- CBC with Differential/Platelet ? ?2. Hyperlipidemia with target LDL less than 100 ?Low fat diet ?- atorvastatin (LIPITOR) 40 MG tablet; Take 1 tablet (40 mg total) by mouth daily at 6 PM.  Dispense: 90 tablet; Refill: 1 ?- fenofibrate (TRICOR) 145 MG tablet; Take 1 tablet (145 mg total) by mouth daily.  Dispense: 90 tablet; Refill: 1 ?- Lipid panel ? ?3. Bipolar 1 disorder ([place]) ?Stress management ?- QUEtiapine (SEROQUEL) 200 MG tablet; Take 1 tablet (200 mg total) by mouth at bedtime.  Dispense: 90 tablet; Refill: 1 ? ?4. Severe episode of recurrent major depressive disorder, without psychotic features ([place]) ?- venlafaxine XR (EFFEXOR-XR) 75 MG 24 hr capsule; Take 1 capsule (75 mg total) by mouth daily with breakfast.  Dispense: 90 capsule; Refill: 1 ? ? ?Labs pending ?Health Maintenance reviewed ?Diet and exercise encouraged ? ?Follow up plan: ?6 months ? ? ?Mary-Margaret Hassell Done, FNP ? ?

## 2021-12-28 LAB — BMP8+EGFR
BUN/Creatinine Ratio: 14 (ref 9–23)
BUN: 10 mg/dL (ref 6–24)
CO2: 23 mmol/L (ref 20–29)
Calcium: 9.5 mg/dL (ref 8.7–10.2)
Chloride: 102 mmol/L (ref 96–106)
Creatinine, Ser: 0.71 mg/dL (ref 0.57–1.00)
Glucose: 110 mg/dL — ABNORMAL HIGH (ref 70–99)
Potassium: 4.4 mmol/L (ref 3.5–5.2)
Sodium: 138 mmol/L (ref 134–144)
eGFR: 102 mL/min/{1.73_m2} (ref >59)

## 2021-12-28 LAB — LIPID PANEL
Chol/HDL Ratio: 6.5 ratio — ABNORMAL HIGH (ref 0.0–4.4)
Cholesterol, Total: 91 mg/dL — ABNORMAL LOW (ref 100–199)
HDL: 14 mg/dL — ABNORMAL LOW (ref >39)
LDL Chol Calc (NIH): 59 mg/dL (ref 0–99)
Triglycerides: 86 mg/dL (ref 0–149)
VLDL Cholesterol Cal: 18 mg/dL (ref 5–40)

## 2021-12-28 LAB — CBC WITH DIFFERENTIAL/PLATELET
Basophils Absolute: 0.1 10*3/uL (ref 0.0–0.2)
Basos: 1 %
EOS (ABSOLUTE): 0.3 10*3/uL (ref 0.0–0.4)
Eos: 3 %
Hematocrit: 41 % (ref 34.0–46.6)
Hemoglobin: 13.5 g/dL (ref 11.1–15.9)
Immature Grans (Abs): 0 10*3/uL (ref 0.0–0.1)
Immature Granulocytes: 0 %
Lymphocytes Absolute: 2.4 10*3/uL (ref 0.7–3.1)
Lymphs: 25 %
MCH: 29.6 pg (ref 26.6–33.0)
MCHC: 32.9 g/dL (ref 31.5–35.7)
MCV: 90 fL (ref 79–97)
Monocytes Absolute: 0.8 10*3/uL (ref 0.1–0.9)
Monocytes: 8 %
Neutrophils Absolute: 6.2 10*3/uL (ref 1.4–7.0)
Neutrophils: 63 %
Platelets: 290 10*3/uL (ref 150–450)
RBC: 4.56 x10E6/uL (ref 3.77–5.28)
RDW: 12.1 % (ref 11.7–15.4)
WBC: 9.7 10*3/uL (ref 3.4–10.8)

## 2022-08-20 ENCOUNTER — Other Ambulatory Visit: Payer: Self-pay | Admitting: Nurse Practitioner

## 2022-08-20 DIAGNOSIS — F319 Bipolar disorder, unspecified: Secondary | ICD-10-CM

## 2022-08-20 DIAGNOSIS — F332 Major depressive disorder, recurrent severe without psychotic features: Secondary | ICD-10-CM

## 2022-08-23 DIAGNOSIS — Z419 Encounter for procedure for purposes other than remedying health state, unspecified: Secondary | ICD-10-CM | POA: Diagnosis not present

## 2022-08-26 ENCOUNTER — Other Ambulatory Visit: Payer: Self-pay | Admitting: Nurse Practitioner

## 2022-08-26 DIAGNOSIS — I10 Essential (primary) hypertension: Secondary | ICD-10-CM

## 2022-08-26 DIAGNOSIS — E785 Hyperlipidemia, unspecified: Secondary | ICD-10-CM

## 2022-09-23 DIAGNOSIS — Z419 Encounter for procedure for purposes other than remedying health state, unspecified: Secondary | ICD-10-CM | POA: Diagnosis not present

## 2022-09-27 ENCOUNTER — Other Ambulatory Visit: Payer: Self-pay | Admitting: Nurse Practitioner

## 2022-09-27 DIAGNOSIS — E785 Hyperlipidemia, unspecified: Secondary | ICD-10-CM

## 2022-09-27 DIAGNOSIS — F332 Major depressive disorder, recurrent severe without psychotic features: Secondary | ICD-10-CM

## 2022-09-27 DIAGNOSIS — F319 Bipolar disorder, unspecified: Secondary | ICD-10-CM

## 2022-09-27 DIAGNOSIS — I10 Essential (primary) hypertension: Secondary | ICD-10-CM

## 2022-09-27 NOTE — Telephone Encounter (Signed)
MMM NTBS 30 days given 08/27/22

## 2022-09-27 NOTE — Telephone Encounter (Signed)
Pt scheduled appt for Monday 01/08

## 2022-09-30 ENCOUNTER — Encounter: Payer: Self-pay | Admitting: Nurse Practitioner

## 2022-09-30 ENCOUNTER — Ambulatory Visit (INDEPENDENT_AMBULATORY_CARE_PROVIDER_SITE_OTHER): Payer: Medicaid Other | Admitting: Nurse Practitioner

## 2022-09-30 VITALS — BP 110/71 | HR 64 | Temp 97.6°F | Resp 20 | Ht 62.0 in | Wt 128.0 lb

## 2022-09-30 DIAGNOSIS — E785 Hyperlipidemia, unspecified: Secondary | ICD-10-CM

## 2022-09-30 DIAGNOSIS — F319 Bipolar disorder, unspecified: Secondary | ICD-10-CM | POA: Diagnosis not present

## 2022-09-30 DIAGNOSIS — I10 Essential (primary) hypertension: Secondary | ICD-10-CM | POA: Diagnosis not present

## 2022-09-30 DIAGNOSIS — I959 Hypotension, unspecified: Secondary | ICD-10-CM | POA: Diagnosis not present

## 2022-09-30 DIAGNOSIS — F332 Major depressive disorder, recurrent severe without psychotic features: Secondary | ICD-10-CM | POA: Diagnosis not present

## 2022-09-30 LAB — POCT UA - MICROSCOPIC ONLY

## 2022-09-30 LAB — LIPID PANEL

## 2022-09-30 MED ORDER — QUETIAPINE FUMARATE 200 MG PO TABS: 200.0000 mg | ORAL_TABLET | Freq: Every day | ORAL | 1 refills | Status: DC

## 2022-09-30 MED ORDER — ALPRAZOLAM 0.25 MG PO TABS: 0.2500 mg | ORAL_TABLET | Freq: Two times a day (BID) | ORAL | 5 refills | Status: DC | PRN

## 2022-09-30 MED ORDER — VENLAFAXINE HCL ER 75 MG PO CP24: 75.0000 mg | ORAL_CAPSULE | Freq: Every day | ORAL | 1 refills | Status: DC

## 2022-09-30 MED ORDER — ATORVASTATIN CALCIUM 40 MG PO TABS: 40.0000 mg | ORAL_TABLET | Freq: Every day | ORAL | 1 refills | Status: DC

## 2022-09-30 MED ORDER — FENOFIBRATE 145 MG PO TABS: 145.0000 mg | ORAL_TABLET | Freq: Every day | ORAL | 1 refills | Status: DC

## 2022-09-30 MED ORDER — LISINOPRIL-HYDROCHLOROTHIAZIDE 20-12.5 MG PO TABS: 1.0000 | ORAL_TABLET | Freq: Every day | ORAL | 1 refills | Status: DC

## 2022-09-30 NOTE — Patient Instructions (Signed)

## 2022-09-30 NOTE — Progress Notes (Addendum)
Subjective:    Patient ID: Lindsay Dickerson, female    DOB: [birth year], 52 y.o.   MRN: [id number]  Chief Complaint: medical management of chronic issues     HPI:  Lindsay Dickerson is a 52 y.o. who identifies as a female who was assigned female at birth.   Social history: Lives with: daughter Work history: works at a [employer] in today for follow up of the following chronic medical issues:  1. Primary hypertension No c/I chest pain, sob or headache. Doe snot check blood pressure at home. BP Readings from Last 3 Encounters:  12/27/21 105/70  06/28/21 123/79  10/26/20 109/66     2. Hyperlipidemia with target LDL less than 100 Does not wtach diet and does no dedictaed exercise. Lab Results  Component Value Date   CHOL 91 (L) 12/27/2021   HDL 14 (L) 12/27/2021   LDLCALC 59 12/27/2021   TRIG 86 12/27/2021   CHOLHDL 6.5 (H) 12/27/2021     3. Bipolar 1 disorder ([place]) Is on seorquel and that is working well  to keep mood swings some what undercontrol.    09/30/2022    8:37 AM 12/27/2021   11:24 AM 06/28/2021    8:43 AM 02/25/2020   12:42 PM  GAD 7 : Generalized Anxiety Score  Nervous, Anxious, on Edge 2 1 2  0  Control/stop worrying 2 2 2  0  Worry too much - different things 2 2 2  0  Trouble relaxing 2 0 2 1  Restless 0 1 2 0  Easily annoyed or irritable 1 2 3  0  Afraid - awful might happen 0 0 2 0  Total GAD 7 Score 9 8 15 1   Anxiety Difficulty Somewhat difficult Somewhat difficult Somewhat difficult Not difficult at all     4. Severe episode of recurrent major depressive disorder, without psychotic features ([place]) Has been on effexor for years. Also take xanax as needed. Does not look like she takes this every day.    09/30/2022    8:37 AM 12/27/2021   11:24 AM 06/28/2021    8:42 AM  Depression screen PHQ 2/9  Decreased Interest 2 1 2   Down, Depressed, Hopeless 2 1 2   PHQ - 2 Score 4 2 4   Altered sleeping 1 0 3  Tired, decreased energy 1 3 3    Change in appetite 1 2 1   Feeling bad or failure about yourself  0 2 2  Trouble concentrating 1 1 2   Moving slowly or fidgety/restless 0 0 2  Suicidal thoughts 0 0 0  PHQ-9 Score 8 10 17   Difficult doing work/chores Somewhat difficult Somewhat difficult Somewhat difficult      New complaints: None today  Allergies  Allergen Reactions   Erythromycin Anaphylaxis   Zithromax [Azithromycin] Anaphylaxis and Swelling    Throat started closing up   Flagyl [Metronidazole]    Levaquin [Levofloxacin In D5w]    Outpatient Encounter Medications as of 09/30/2022  Medication Sig   ALPRAZolam (XANAX) 0.25 MG tablet Take 1 tablet (0.25 mg total) by mouth 2 (two) times daily as needed for anxiety. (Patient not taking: Reported on 12/27/2021)   atorvastatin (LIPITOR) 40 MG tablet Take 1 tablet (40 mg total) by mouth daily at 6 PM. (NEEDS TO BE SEEN BEFORE NEXT REFILL)   fenofibrate (TRICOR) 145 MG tablet Take 1 tablet (145 mg total) by mouth daily. (NEEDS TO BE SEEN BEFORE NEXT REFILL)   lisinopril-hydrochlorothiazide (ZESTORETIC) 20-12.5 MG tablet Take 1  tablet by mouth daily. (NEEDS TO BE SEEN BEFORE NEXT REFILL)   QUEtiapine (SEROQUEL) 200 MG tablet Take 1 tablet (200 mg total) by mouth at bedtime. (NEEDS TO BE SEEN BEFORE NEXT REFILL)   venlafaxine XR (EFFEXOR-XR) 75 MG 24 hr capsule Take 1 capsule (75 mg total) by mouth daily with breakfast. (NEEDS TO BE SEEN BEFORE NEXT REFILL)   No facility-administered encounter medications on file as of 09/30/2022.    Past Surgical History:  Procedure Laterality Date   ABDOMINAL HYSTERECTOMY     partial, rt bso   APPENDECTOMY     BREAST SURGERY     EYE SURGERY     NASAL SEPTUM SURGERY     THYROIDECTOMY, PARTIAL      Family History  Problem Relation Age of Onset   Hypertension Mother    Hyperlipidemia Father    Hypertension Father       Controlled substance contract: n/a     Review of Systems  Constitutional:  Negative for diaphoresis.   Eyes:  Negative for pain.  Respiratory:  Negative for shortness of breath.   Cardiovascular:  Negative for chest pain, palpitations and leg swelling.  Gastrointestinal:  Negative for abdominal pain.  Endocrine: Negative for polydipsia.  Skin:  Negative for rash.  Neurological:  Negative for dizziness, weakness and headaches.  Hematological:  Does not bruise/bleed easily.  All other systems reviewed and are negative.      Objective:   Physical Exam Vitals and nursing note reviewed.  Constitutional:      General: She is not in acute distress.    Appearance: Normal appearance. She is well-developed.  HENT:     Head: Normocephalic.     Right Ear: Tympanic membrane normal.     Left Ear: Tympanic membrane normal.     Nose: Nose normal.     Mouth/Throat:     Mouth: Mucous membranes are moist.  Eyes:     Pupils: Pupils are equal, round, and reactive to light.  Neck:     Vascular: No carotid bruit or JVD.  Cardiovascular:     Rate and Rhythm: Normal rate and regular rhythm.     Heart sounds: Normal heart sounds.  Pulmonary:     Effort: Pulmonary effort is normal. No respiratory distress.     Breath sounds: Normal breath sounds. No wheezing or rales.  Chest:     Chest wall: No tenderness.  Abdominal:     General: Bowel sounds are normal. There is no distension or abdominal bruit.     Palpations: Abdomen is soft. There is no hepatomegaly, splenomegaly, mass or pulsatile mass.     Tenderness: There is no abdominal tenderness.  Musculoskeletal:        General: Normal range of motion.     Cervical back: Normal range of motion and neck supple.  Lymphadenopathy:     Cervical: No cervical adenopathy.  Skin:    General: Skin is warm and dry.  Neurological:     Mental Status: She is alert and oriented to person, place, and time.     Deep Tendon Reflexes: Reflexes are normal and symmetric.  Psychiatric:        Behavior: Behavior normal.        Thought Content: Thought content  normal.        Judgment: Judgment normal.    BP 110/71   Pulse 64   Temp 97.6 F (36.4 C) (Temporal)   Resp 20   Ht 5\' 2"  (1.575 m)  Wt 128 lb (58.1 kg)   SpO2 100%   BMI 23.41 kg/m    Quentin Angst, FNP      Assessment & Plan:   Lindsay Dickerson comes in today with chief complaint of Medical Management of Chronic Issues (Issues with BP being low/)   Diagnosis and orders addressed:  1. Primary hypertension Low sodium diet - lisinopril-hydrochlorothiazide (ZESTORETIC) 20-12.5 MG tablet; Take 1 tablet by mouth daily. (NEEDS TO BE SEEN BEFORE NEXT REFILL)  Dispense: 90 tablet; Refill: 1 - CBC with Differential/Platelet - CMP14+EGFR  2. Hyperlipidemia with target LDL less than 100 Low fat diet - atorvastatin (LIPITOR) 40 MG tablet; Take 1 tablet (40 mg total) by mouth daily at 6 PM. (NEEDS TO BE SEEN BEFORE NEXT REFILL)  Dispense: 90 tablet; Refill: 1 - fenofibrate (TRICOR) 145 MG tablet; Take 1 tablet (145 mg total) by mouth daily. (NEEDS TO BE SEEN BEFORE NEXT REFILL)  Dispense: 90 tablet; Refill: 1 - Lipid panel  3. Bipolar 1 disorder ([facility]) Stress management - QUEtiapine (SEROQUEL) 200 MG tablet; Take 1 tablet (200 mg total) by mouth at bedtime. (NEEDS TO BE SEEN BEFORE NEXT REFILL)  Dispense: 90 tablet; Refill: 1 - ALPRAZolam (XANAX) 0.25 MG tablet; Take 1 tablet (0.25 mg total) by mouth 2 (two) times daily as needed for anxiety.  Dispense: 60 tablet; Refill: 5  4. Severe episode of recurrent major depressive disorder, without psychotic features ([facility]) - venlafaxine XR (EFFEXOR-XR) 75 MG 24 hr capsule; Take 1 capsule (75 mg total) by mouth daily with breakfast. (NEEDS TO BE SEEN BEFORE NEXT REFILL)  Dispense: 90 capsule; Refill: 1   Labs pending Health Maintenance reviewed Diet and exercise encouraged  Follow up plan: 6 months   Mary-Margaret Daphine Deutscher, FNP

## 2022-09-30 NOTE — Addendum Note (Signed)
Addended by: Rolena Infante on: 09/30/2022 09:09 AM   Modules accepted: Orders

## 2022-10-01 LAB — CBC WITH DIFFERENTIAL/PLATELET
Basophils Absolute: 0.1 10*3/uL (ref 0.0–0.2)
Basos: 1 %
EOS (ABSOLUTE): 0.6 10*3/uL — ABNORMAL HIGH (ref 0.0–0.4)
Eos: 10 %
Hematocrit: 40.5 % (ref 34.0–46.6)
Hemoglobin: 13.6 g/dL (ref 11.1–15.9)
Immature Grans (Abs): 0 10*3/uL (ref 0.0–0.1)
Immature Granulocytes: 0 %
Lymphocytes Absolute: 2.6 10*3/uL (ref 0.7–3.1)
Lymphs: 42 %
MCH: 30.6 pg (ref 26.6–33.0)
MCHC: 33.6 g/dL (ref 31.5–35.7)
MCV: 91 fL (ref 79–97)
Monocytes Absolute: 0.6 10*3/uL (ref 0.1–0.9)
Monocytes: 9 %
Neutrophils Absolute: 2.4 10*3/uL (ref 1.4–7.0)
Neutrophils: 38 %
Platelets: 287 10*3/uL (ref 150–450)
RBC: 4.44 x10E6/uL (ref 3.77–5.28)
RDW: 12.1 % (ref 11.7–15.4)
WBC: 6.3 10*3/uL (ref 3.4–10.8)

## 2022-10-01 LAB — CMP14+EGFR
ALT: 13 IU/L (ref 0–32)
AST: 24 IU/L (ref 0–40)
Albumin/Globulin Ratio: 1.5 (ref 1.2–2.2)
Albumin: 4.5 g/dL (ref 3.8–4.9)
Alkaline Phosphatase: 53 IU/L (ref 44–121)
BUN/Creatinine Ratio: 15 (ref 9–23)
BUN: 11 mg/dL (ref 6–24)
Bilirubin Total: 0.5 mg/dL (ref 0.0–1.2)
CO2: 21 mmol/L (ref 20–29)
Calcium: 9.7 mg/dL (ref 8.7–10.2)
Chloride: 99 mmol/L (ref 96–106)
Creatinine, Ser: 0.74 mg/dL (ref 0.57–1.00)
Globulin, Total: 3 g/dL (ref 1.5–4.5)
Glucose: 162 mg/dL — ABNORMAL HIGH (ref 70–99)
Potassium: 4.3 mmol/L (ref 3.5–5.2)
Sodium: 135 mmol/L (ref 134–144)
Total Protein: 7.5 g/dL (ref 6.0–8.5)
eGFR: 97 mL/min/{1.73_m2} (ref >59)

## 2022-10-01 LAB — LIPID PANEL
Chol/HDL Ratio: 14.1 ratio — ABNORMAL HIGH (ref 0.0–4.4)
Cholesterol, Total: 127 mg/dL (ref 100–199)
HDL: 9 mg/dL — ABNORMAL LOW (ref >39)
LDL Chol Calc (NIH): 83 mg/dL (ref 0–99)
Triglycerides: 200 mg/dL — ABNORMAL HIGH (ref 0–149)
VLDL Cholesterol Cal: 35 mg/dL (ref 5–40)

## 2022-10-24 DIAGNOSIS — Z419 Encounter for procedure for purposes other than remedying health state, unspecified: Secondary | ICD-10-CM | POA: Diagnosis not present

## 2022-11-19 ENCOUNTER — Encounter: Payer: Self-pay | Admitting: Nurse Practitioner

## 2022-11-22 DIAGNOSIS — Z419 Encounter for procedure for purposes other than remedying health state, unspecified: Secondary | ICD-10-CM | POA: Diagnosis not present

## 2022-12-23 DIAGNOSIS — Z419 Encounter for procedure for purposes other than remedying health state, unspecified: Secondary | ICD-10-CM | POA: Diagnosis not present

## 2023-01-22 DIAGNOSIS — Z419 Encounter for procedure for purposes other than remedying health state, unspecified: Secondary | ICD-10-CM | POA: Diagnosis not present

## 2023-02-22 DIAGNOSIS — Z419 Encounter for procedure for purposes other than remedying health state, unspecified: Secondary | ICD-10-CM | POA: Diagnosis not present

## 2023-03-24 DIAGNOSIS — Z419 Encounter for procedure for purposes other than remedying health state, unspecified: Secondary | ICD-10-CM | POA: Diagnosis not present

## 2023-03-31 ENCOUNTER — Ambulatory Visit: Payer: Medicaid Other | Admitting: Nurse Practitioner

## 2023-04-10 ENCOUNTER — Encounter: Payer: Self-pay | Admitting: Nurse Practitioner

## 2023-04-10 ENCOUNTER — Other Ambulatory Visit: Payer: Self-pay

## 2023-04-10 ENCOUNTER — Ambulatory Visit (INDEPENDENT_AMBULATORY_CARE_PROVIDER_SITE_OTHER): Payer: Medicaid Other | Admitting: Nurse Practitioner

## 2023-04-10 VITALS — BP 106/69 | HR 91 | Temp 97.5°F | Resp 20 | Ht 62.0 in | Wt 122.0 lb

## 2023-04-10 DIAGNOSIS — I10 Essential (primary) hypertension: Secondary | ICD-10-CM | POA: Diagnosis not present

## 2023-04-10 DIAGNOSIS — F319 Bipolar disorder, unspecified: Secondary | ICD-10-CM | POA: Diagnosis not present

## 2023-04-10 DIAGNOSIS — E785 Hyperlipidemia, unspecified: Secondary | ICD-10-CM

## 2023-04-10 DIAGNOSIS — R739 Hyperglycemia, unspecified: Secondary | ICD-10-CM | POA: Diagnosis not present

## 2023-04-10 DIAGNOSIS — Z1231 Encounter for screening mammogram for malignant neoplasm of breast: Secondary | ICD-10-CM

## 2023-04-10 DIAGNOSIS — F332 Major depressive disorder, recurrent severe without psychotic features: Secondary | ICD-10-CM

## 2023-04-10 MED ORDER — LISINOPRIL-HYDROCHLOROTHIAZIDE 20-12.5 MG PO TABS
1.0000 | ORAL_TABLET | Freq: Every day | ORAL | 1 refills | Status: DC
Start: 2023-04-10 — End: 2023-10-16

## 2023-04-10 MED ORDER — ATORVASTATIN CALCIUM 40 MG PO TABS
40.0000 mg | ORAL_TABLET | Freq: Every day | ORAL | 1 refills | Status: DC
Start: 2023-04-10 — End: 2023-10-16

## 2023-04-10 MED ORDER — QUETIAPINE FUMARATE 200 MG PO TABS
200.0000 mg | ORAL_TABLET | Freq: Every day | ORAL | 1 refills | Status: DC
Start: 2023-04-10 — End: 2023-10-16

## 2023-04-10 MED ORDER — ALPRAZOLAM 0.25 MG PO TABS
0.2500 mg | ORAL_TABLET | Freq: Two times a day (BID) | ORAL | 5 refills | Status: DC | PRN
Start: 2023-04-10 — End: 2023-10-16

## 2023-04-10 MED ORDER — FENOFIBRATE 145 MG PO TABS
145.0000 mg | ORAL_TABLET | Freq: Every day | ORAL | 1 refills | Status: DC
Start: 2023-04-10 — End: 2023-10-16

## 2023-04-10 MED ORDER — VENLAFAXINE HCL ER 75 MG PO CP24
75.0000 mg | ORAL_CAPSULE | Freq: Every day | ORAL | 1 refills | Status: DC
Start: 2023-04-10 — End: 2023-10-16

## 2023-04-10 NOTE — Progress Notes (Signed)
Subjective:    Patient ID: Lindsay Dickerson, female    DOB: 06-12-1971, 52 y.o.   MRN: [id number]   Chief Complaint: medical management of chronic issues     HPI:  Lindsay Dickerson is a 52 y.o. who identifies as a female who was assigned female at birth.   Social history: Lives with: boyfriend Work history: works at [employer] in today for follow up of the following chronic medical issues:  1. Primary hypertension No c/o chest pain, sob or headache. Does not check blood pressure at home. BP Readings from Last 3 Encounters:  09/30/22 110/71  12/27/21 105/70  06/28/21 123/79     2. Hyperlipidemia with target LDL less than 100 Does not really watch diet. Does no exercise. Lab Results  Component Value Date   CHOL 127 09/30/2022   HDL 9 (L) 09/30/2022   LDLCALC 83 09/30/2022   TRIG 200 (H) 09/30/2022   CHOLHDL 14.1 (H) 09/30/2022     3. Bipolar 1 disorder ([facility]) 4. Severe episode of recurrent major depressive disorder, without psychotic features ([facility]) Is on effexor, seroquel and xanax combination. Says she is doing well.    04/10/2023   10:59 AM 09/30/2022    8:37 AM 12/27/2021   11:24 AM  Depression screen PHQ 2/9  Decreased Interest 2 2 1   Down, Depressed, Hopeless 2 2 1   PHQ - 2 Score 4 4 2   Altered sleeping 1 1 0  Tired, decreased energy 1 1 3   Change in appetite 1 1 2   Feeling bad or failure about yourself  0 0 2  Trouble concentrating 1 1 1   Moving slowly or fidgety/restless 0 0 0  Suicidal thoughts 0 0 0  PHQ-9 Score 8 8 10   Difficult doing work/chores Not difficult at all Somewhat difficult Somewhat difficult      04/10/2023   10:59 AM 09/30/2022    8:37 AM 12/27/2021   11:24 AM 06/28/2021    8:43 AM  GAD 7 : Generalized Anxiety Score  Nervous, Anxious, on Edge 2 2 1 2   Control/stop worrying 2 2 2 2   Worry too much - different things 2 2 2 2   Trouble relaxing 2 2 0 2  Restless 0 0 1 2  Easily annoyed or irritable 1 1 2 3   Afraid -  awful might happen 0 0 0 2  Total GAD 7 Score 9 9 8 15   Anxiety Difficulty Somewhat difficult Somewhat difficult Somewhat difficult Somewhat difficult      New complaints: None today  Allergies  Allergen Reactions   Erythromycin Anaphylaxis   Zithromax [Azithromycin] Anaphylaxis and Swelling    Throat started closing up   Flagyl [Metronidazole]    Levaquin [Levofloxacin In D5w]    Outpatient Encounter Medications as of 04/10/2023  Medication Sig   ALPRAZolam (XANAX) 0.25 MG tablet Take 1 tablet (0.25 mg total) by mouth 2 (two) times daily as needed for anxiety.   atorvastatin (LIPITOR) 40 MG tablet Take 1 tablet (40 mg total) by mouth daily at 6 PM. (NEEDS TO BE SEEN BEFORE NEXT REFILL)   fenofibrate (TRICOR) 145 MG tablet Take 1 tablet (145 mg total) by mouth daily. (NEEDS TO BE SEEN BEFORE NEXT REFILL)   lisinopril-hydrochlorothiazide (ZESTORETIC) 20-12.5 MG tablet Take 1 tablet by mouth daily. (NEEDS TO BE SEEN BEFORE NEXT REFILL)   QUEtiapine (SEROQUEL) 200 MG tablet Take 1 tablet (200 mg total) by mouth at bedtime. (NEEDS TO BE SEEN BEFORE NEXT  REFILL)   venlafaxine XR (EFFEXOR-XR) 75 MG 24 hr capsule Take 1 capsule (75 mg total) by mouth daily with breakfast. (NEEDS TO BE SEEN BEFORE NEXT REFILL)   No facility-administered encounter medications on file as of 04/10/2023.    Past Surgical History:  Procedure Laterality Date   ABDOMINAL HYSTERECTOMY     partial, rt bso   APPENDECTOMY     BREAST SURGERY     EYE SURGERY     NASAL SEPTUM SURGERY     THYROIDECTOMY, PARTIAL      Family History  Problem Relation Age of Onset   Hypertension Mother    Hyperlipidemia Father    Hypertension Father       Controlled substance contract: n/a     Review of Systems  Constitutional:  Negative for diaphoresis.  Eyes:  Negative for pain.  Respiratory:  Negative for shortness of breath.   Cardiovascular:  Negative for chest pain, palpitations and leg swelling.   Gastrointestinal:  Negative for abdominal pain.  Endocrine: Negative for polydipsia.  Skin:  Negative for rash.  Neurological:  Negative for dizziness, weakness and headaches.  Hematological:  Does not bruise/bleed easily.  All other systems reviewed and are negative.      Objective:   Physical Exam Vitals and nursing note reviewed.  Constitutional:      General: She is not in acute distress.    Appearance: Normal appearance. She is well-developed.  HENT:     Head: Normocephalic.     Right Ear: Tympanic membrane normal.     Left Ear: Tympanic membrane normal.     Nose: Nose normal.     Mouth/Throat:     Mouth: Mucous membranes are moist.  Eyes:     Pupils: Pupils are equal, round, and reactive to light.  Neck:     Vascular: No carotid bruit or JVD.  Cardiovascular:     Rate and Rhythm: Normal rate and regular rhythm.     Heart sounds: Normal heart sounds.  Pulmonary:     Effort: Pulmonary effort is normal. No respiratory distress.     Breath sounds: Normal breath sounds. No wheezing or rales.  Chest:     Chest wall: No tenderness.  Abdominal:     General: Bowel sounds are normal. There is no distension or abdominal bruit.     Palpations: Abdomen is soft. There is no hepatomegaly, splenomegaly, mass or pulsatile mass.     Tenderness: There is no abdominal tenderness.  Musculoskeletal:        General: Normal range of motion.     Cervical back: Normal range of motion and neck supple.  Lymphadenopathy:     Cervical: No cervical adenopathy.  Skin:    General: Skin is warm and dry.  Neurological:     Mental Status: She is alert and oriented to person, place, and time.     Deep Tendon Reflexes: Reflexes are normal and symmetric.  Psychiatric:        Behavior: Behavior normal.        Thought Content: Thought content normal.        Judgment: Judgment normal.    BP 106/69   Pulse 91   Temp (!) 97.5 F (36.4 C) (Temporal)   Resp 20   Ht 5\' 2"  (1.575 m)   Wt 122 lb  (55.3 kg)   SpO2 100%   BMI 22.31 kg/m         Assessment & Plan:   Lindsay Dickerson comes  in today with chief complaint of Medical Management of Chronic Issues   Diagnosis and orders addressed:  1. Primary hypertension Low sodium diet - lisinopril-hydrochlorothiazide (ZESTORETIC) 20-12.5 MG tablet; Take 1 tablet by mouth daily. (NEEDS TO BE SEEN BEFORE NEXT REFILL)  Dispense: 90 tablet; Refill: 1 - CBC with Differential/Platelet - CMP14+EGFR  2. Hyperlipidemia with target LDL less than 100 Low fat diet - atorvastatin (LIPITOR) 40 MG tablet; Take 1 tablet (40 mg total) by mouth daily at 6 PM. (NEEDS TO BE SEEN BEFORE NEXT REFILL)  Dispense: 90 tablet; Refill: 1 - fenofibrate (TRICOR) 145 MG tablet; Take 1 tablet (145 mg total) by mouth daily. (NEEDS TO BE SEEN BEFORE NEXT REFILL)  Dispense: 90 tablet; Refill: 1 - Lipid panel  3. Bipolar 1 disorder ([facility]CC) - ALPRAZolam (XANAX) 0.25 MG tablet; Take 1 tablet (0.25 mg total) by mouth 2 (two) times daily as needed for anxiety.  Dispense: 60 tablet; Refill: 5 - QUEtiapine (SEROQUEL) 200 MG tablet; Take 1 tablet (200 mg total) by mouth at bedtime. (NEEDS TO BE SEEN BEFORE NEXT REFILL)  Dispense: 90 tablet; Refill: 1  4. Severe episode of recurrent major depressive disorder, without psychotic features ([facility]) Stress management - venlafaxine XR (EFFEXOR-XR) 75 MG 24 hr capsule; Take 1 capsule (75 mg total) by mouth daily with breakfast. (NEEDS TO BE SEEN BEFORE NEXT REFILL)  Dispense: 90 capsule; Refill: 1  5. Encounter for mammogram to establish baseline mammogram - MM 3D DIAGNOSTIC MAMMOGRAM BILATERAL BREAST W/IMPLANT   Labs pending Health Maintenance reviewed Diet and exercise encouraged  Follow up plan: 6 months   Mary-Margaret Daphine Deutscher, FNP

## 2023-04-10 NOTE — Patient Instructions (Signed)
Exercising to Stay Healthy To become healthy and stay healthy, it is recommended that you do moderate-intensity and vigorous-intensity exercise. You can tell that you are exercising at a moderate intensity if your heart starts beating faster and you start breathing faster but can still hold a conversation. You can tell that you are exercising at a vigorous intensity if you are breathing much harder and faster and cannot hold a conversation while exercising. How can exercise benefit me? Exercising regularly is important. It has many health benefits, such as: Improving overall fitness, flexibility, and endurance. Increasing bone density. Helping with weight control. Decreasing body fat. Increasing muscle strength and endurance. Reducing stress and tension, anxiety, depression, or anger. Improving overall health. What guidelines should I follow while exercising? Before you start a new exercise program, talk with your health care provider. Do not exercise so much that you hurt yourself, feel dizzy, or get very short of breath. Wear comfortable clothes and wear shoes with good support. Drink plenty of water while you exercise to prevent dehydration or heat stroke. Work out until your breathing and your heartbeat get faster (moderate intensity). How often should I exercise? Choose an activity that you enjoy, and set realistic goals. Your health care provider can help you make an activity plan that is individually designed and works best for you. Exercise regularly as told by your health care provider. This may include: Doing strength training two times a week, such as: Lifting weights. Using resistance bands. Push-ups. Sit-ups. Yoga. Doing a certain intensity of exercise for a given amount of time. Choose from these options: A total of 150 minutes of moderate-intensity exercise every week. A total of 75 minutes of vigorous-intensity exercise every week. A mix of moderate-intensity and  vigorous-intensity exercise every week. Children, pregnant women, people who have not exercised regularly, people who are overweight, and older adults may need to talk with a health care provider about what activities are safe to perform. If you have a medical condition, be sure to talk with your health care provider before you start a new exercise program. What are some exercise ideas? Moderate-intensity exercise ideas include: Walking 1 mile (1.6 km) in about 15 minutes. Biking. Hiking. Golfing. Dancing. Water aerobics. Vigorous-intensity exercise ideas include: Walking 4.5 miles (7.2 km) or more in about 1 hour. Jogging or running 5 miles (8 km) in about 1 hour. Biking 10 miles (16.1 km) or more in about 1 hour. Lap swimming. Roller-skating or in-line skating. Cross-country skiing. Vigorous competitive sports, such as football, basketball, and soccer. Jumping rope. Aerobic dancing. What are some everyday activities that can help me get exercise? Yard work, such as: Pushing a lawn mower. Raking and bagging leaves. Washing your car. Pushing a stroller. Shoveling snow. Gardening. Washing windows or floors. How can I be more active in my day-to-day activities? Use stairs instead of an elevator. Take a walk during your lunch break. If you drive, park your car farther away from your work or school. If you take public transportation, get off one stop early and walk the rest of the way. Stand up or walk around during all of your indoor phone calls. Get up, stretch, and walk around every 30 minutes throughout the day. Enjoy exercise with a friend. Support to continue exercising will help you keep a regular routine of activity. Where to find more information You can find more information about exercising to stay healthy from: [place] Department of Health and Human Services: www.hhs.gov Centers for Disease Control and Prevention (  CDC): www.cdc.gov Summary Exercising regularly is  important. It will improve your overall fitness, flexibility, and endurance. Regular exercise will also improve your overall health. It can help you control your weight, reduce stress, and improve your bone density. Do not exercise so much that you hurt yourself, feel dizzy, or get very short of breath. Before you start a new exercise program, talk with your health care provider. This information is not intended to replace advice given to you by your health care provider. Make sure you discuss any questions you have with your health care provider. Document Revised: 01/05/2021 Document Reviewed: 01/05/2021 Elsevier Patient Education  2024 [employer]  

## 2023-04-11 LAB — CMP14+EGFR
ALT: 12 IU/L (ref 0–32)
AST: 29 IU/L (ref 0–40)
Albumin: 4.5 g/dL (ref 3.8–4.9)
Alkaline Phosphatase: 51 IU/L (ref 44–121)
BUN/Creatinine Ratio: 20 (ref 9–23)
BUN: 13 mg/dL (ref 6–24)
Bilirubin Total: 0.6 mg/dL (ref 0.0–1.2)
CO2: 25 mmol/L (ref 20–29)
Calcium: 9.7 mg/dL (ref 8.7–10.2)
Chloride: 100 mmol/L (ref 96–106)
Creatinine, Ser: 0.65 mg/dL (ref 0.57–1.00)
Globulin, Total: 2.6 g/dL (ref 1.5–4.5)
Glucose: 130 mg/dL — ABNORMAL HIGH (ref 70–99)
Potassium: 4.5 mmol/L (ref 3.5–5.2)
Sodium: 138 mmol/L (ref 134–144)
Total Protein: 7.1 g/dL (ref 6.0–8.5)
eGFR: 105 mL/min/{1.73_m2} (ref >59)

## 2023-04-11 LAB — CBC WITH DIFFERENTIAL/PLATELET
Basophils Absolute: 0.1 10*3/uL (ref 0.0–0.2)
Basos: 1 %
EOS (ABSOLUTE): 0.5 10*3/uL — ABNORMAL HIGH (ref 0.0–0.4)
Eos: 8 %
Hematocrit: 38.1 % (ref 34.0–46.6)
Hemoglobin: 12.5 g/dL (ref 11.1–15.9)
Immature Grans (Abs): 0 10*3/uL (ref 0.0–0.1)
Immature Granulocytes: 0 %
Lymphocytes Absolute: 2.7 10*3/uL (ref 0.7–3.1)
Lymphs: 41 %
MCH: 29.3 pg (ref 26.6–33.0)
MCHC: 32.8 g/dL (ref 31.5–35.7)
MCV: 89 fL (ref 79–97)
Monocytes Absolute: 0.6 10*3/uL (ref 0.1–0.9)
Monocytes: 10 %
Neutrophils Absolute: 2.5 10*3/uL (ref 1.4–7.0)
Neutrophils: 40 %
Platelets: 262 10*3/uL (ref 150–450)
RBC: 4.26 x10E6/uL (ref 3.77–5.28)
RDW: 13 % (ref 11.7–15.4)
WBC: 6.4 10*3/uL (ref 3.4–10.8)

## 2023-04-11 LAB — LIPID PANEL
Chol/HDL Ratio: 9.3 ratio — ABNORMAL HIGH (ref 0.0–4.4)
Cholesterol, Total: 102 mg/dL (ref 100–199)
HDL: 11 mg/dL — ABNORMAL LOW (ref >39)
LDL Chol Calc (NIH): 67 mg/dL (ref 0–99)
Triglycerides: 132 mg/dL (ref 0–149)
VLDL Cholesterol Cal: 24 mg/dL (ref 5–40)

## 2023-04-14 LAB — SPECIMEN STATUS REPORT

## 2023-04-14 LAB — HGB A1C W/O EAG: Hgb A1c MFr Bld: 5.7 % — ABNORMAL HIGH (ref 4.8–5.6)

## 2023-04-24 ENCOUNTER — Ambulatory Visit: Payer: Medicaid Other

## 2023-04-24 DIAGNOSIS — Z419 Encounter for procedure for purposes other than remedying health state, unspecified: Secondary | ICD-10-CM | POA: Diagnosis not present

## 2023-05-25 DIAGNOSIS — Z419 Encounter for procedure for purposes other than remedying health state, unspecified: Secondary | ICD-10-CM | POA: Diagnosis not present

## 2023-06-16 ENCOUNTER — Ambulatory Visit: Payer: Medicaid Other

## 2023-06-27 ENCOUNTER — Other Ambulatory Visit: Payer: Self-pay | Admitting: Nurse Practitioner

## 2023-06-27 DIAGNOSIS — Z1212 Encounter for screening for malignant neoplasm of rectum: Secondary | ICD-10-CM

## 2023-06-27 DIAGNOSIS — Z1211 Encounter for screening for malignant neoplasm of colon: Secondary | ICD-10-CM

## 2023-07-10 ENCOUNTER — Ambulatory Visit
Admission: RE | Admit: 2023-07-10 | Discharge: 2023-07-10 | Disposition: A | Discharge status: Home / Self Care | Payer: Medicaid Other | Source: Ambulatory Visit | Attending: Nurse Practitioner | Admitting: Nurse Practitioner

## 2023-07-10 DIAGNOSIS — Z1211 Encounter for screening for malignant neoplasm of colon: Secondary | ICD-10-CM | POA: Diagnosis not present

## 2023-07-10 DIAGNOSIS — Z1231 Encounter for screening mammogram for malignant neoplasm of breast: Secondary | ICD-10-CM

## 2023-07-10 DIAGNOSIS — Z1212 Encounter for screening for malignant neoplasm of rectum: Secondary | ICD-10-CM | POA: Diagnosis not present

## 2023-07-18 LAB — COLOGUARD: COLOGUARD: NEGATIVE

## 2023-07-25 DIAGNOSIS — Z419 Encounter for procedure for purposes other than remedying health state, unspecified: Secondary | ICD-10-CM | POA: Diagnosis not present

## 2023-08-24 DIAGNOSIS — Z419 Encounter for procedure for purposes other than remedying health state, unspecified: Secondary | ICD-10-CM | POA: Diagnosis not present

## 2023-08-27 ENCOUNTER — Telehealth: Payer: Medicaid Other | Admitting: Physician Assistant

## 2023-08-27 DIAGNOSIS — J039 Acute tonsillitis, unspecified: Secondary | ICD-10-CM | POA: Diagnosis not present

## 2023-08-27 DIAGNOSIS — B9689 Other specified bacterial agents as the cause of diseases classified elsewhere: Secondary | ICD-10-CM

## 2023-08-27 DIAGNOSIS — N760 Acute vaginitis: Secondary | ICD-10-CM | POA: Diagnosis not present

## 2023-08-27 MED ORDER — METRONIDAZOLE 0.75 % VA GEL
1.0000 | Freq: Every day | VAGINAL | 0 refills | Status: AC
Start: 2023-08-27 — End: 2023-09-01

## 2023-08-27 MED ORDER — PREDNISONE 10 MG PO TABS
30.0000 mg | ORAL_TABLET | Freq: Every day | ORAL | 0 refills | Status: AC
Start: 2023-08-27 — End: 2023-08-30

## 2023-08-27 MED ORDER — AMOXICILLIN 500 MG PO TABS
500.0000 mg | ORAL_TABLET | Freq: Two times a day (BID) | ORAL | 0 refills | Status: AC
Start: 2023-08-27 — End: 2023-09-06

## 2023-08-27 NOTE — Progress Notes (Signed)
Virtual Visit Consent   Lindsay Dickerson, you are scheduled for a virtual visit with a [facility] provider today. Just as with appointments in the office, your consent must be obtained to participate. Your consent will be active for this visit and any virtual visit you may have with one of our providers in the next 365 days. If you have a MyChart account, a copy of this consent can be sent to you electronically.  As this is a virtual visit, video technology does not allow for your provider to perform a traditional examination. This may limit your provider's ability to fully assess your condition. If your provider identifies any concerns that need to be evaluated in person or the need to arrange testing (such as labs, EKG, etc.), we will make arrangements to do so. Although advances in technology are sophisticated, we cannot ensure that it will always work on either your end or our end. If the connection with a video visit is poor, the visit may have to be switched to a telephone visit. With either a video or telephone visit, we are not always able to ensure that we have a secure connection.  By engaging in this virtual visit, you consent to the provision of healthcare and authorize for your insurance to be billed (if applicable) for the services provided during this visit. Depending on your insurance coverage, you may receive a charge related to this service.  I need to obtain your verbal consent now. Are you willing to proceed with your visit today? Lindsay Dickerson has provided verbal consent on 08/27/2023 for a virtual visit (video or telephone). Lindsay Dickerson, [place]  Date: 08/27/2023 2:28 PM  Virtual Visit via Video Note   I, Lindsay Dickerson, connected with  Lindsay Dickerson  ([id number], Jan 30, 1971) on 08/27/23 at  2:15 PM EST by a video-enabled telemedicine application and verified that I am speaking with the correct person using two identifiers.  Location: Patient: Virtual  Visit Location Patient: Home Provider: Virtual Visit Location Provider: Home Office   I discussed the limitations of evaluation and management by telemedicine and the availability of in person appointments. The patient expressed understanding and agreed to proceed.    History of Present Illness: Lindsay Dickerson is a 52 y.o. who identifies as a female who was assigned female at birth, and is being seen today for multiple complaints.  Patient endorses sore throat with tonsillary swelling and white patches. Notes sore and tenderness lymph nodes in neck as well with chills and fever. Fatigued yesterday but no other symptoms.  Works in [employer] so has multiple exposures to illness. Denies cough, congestion or other URI symptoms.   Patient also with history of BV and concerned of a current episode. Notes over the past week having vaginal discharge, odor. Denies vaginal or pelvic pain. New sexual partner but no concern for STI. Denies urinary symptoms.     HPI: HPI  Problems:  Patient Active Problem List   Diagnosis Date Noted   Bipolar 1 disorder (HCC) 07/25/2017   Bacterial vaginosis 07/25/2017   Psoriasis 05/14/2016   Depression 10/05/2014   Hypertension 10/27/2013   Hyperlipidemia with target LDL less than 100 10/27/2013    Allergies:  Allergies  Allergen Reactions   Erythromycin Anaphylaxis   Zithromax [Azithromycin] Anaphylaxis and Swelling    Throat started closing up   Levaquin [Levofloxacin In D5w]    Medications:  Current Outpatient Medications:    amoxicillin (AMOXIL) 500 MG tablet, Take  1 tablet (500 mg total) by mouth 2 (two) times daily for 10 days., Disp: 20 tablet, Rfl: 0   metroNIDAZOLE (METROGEL) 0.75 % vaginal gel, Place 1 Applicatorful vaginally at bedtime for 5 days., Disp: 50 g, Rfl: 0   predniSONE (DELTASONE) 10 MG tablet, Take 3 tablets (30 mg total) by mouth daily with breakfast for 3 days., Disp: 9 tablet, Rfl: 0   ALPRAZolam (XANAX) 0.25 MG tablet,  Take 1 tablet (0.25 mg total) by mouth 2 (two) times daily as needed for anxiety., Disp: 60 tablet, Rfl: 5   atorvastatin (LIPITOR) 40 MG tablet, Take 1 tablet (40 mg total) by mouth daily at 6 PM. (NEEDS TO BE SEEN BEFORE NEXT REFILL), Disp: 90 tablet, Rfl: 1   fenofibrate (TRICOR) 145 MG tablet, Take 1 tablet (145 mg total) by mouth daily. (NEEDS TO BE SEEN BEFORE NEXT REFILL), Disp: 90 tablet, Rfl: 1   lisinopril-hydrochlorothiazide (ZESTORETIC) 20-12.5 MG tablet, Take 1 tablet by mouth daily. (NEEDS TO BE SEEN BEFORE NEXT REFILL), Disp: 90 tablet, Rfl: 1   QUEtiapine (SEROQUEL) 200 MG tablet, Take 1 tablet (200 mg total) by mouth at bedtime. (NEEDS TO BE SEEN BEFORE NEXT REFILL), Disp: 90 tablet, Rfl: 1   venlafaxine XR (EFFEXOR-XR) 75 MG 24 hr capsule, Take 1 capsule (75 mg total) by mouth daily with breakfast. (NEEDS TO BE SEEN BEFORE NEXT REFILL), Disp: 90 capsule, Rfl: 1  Observations/Objective:  No labored breathing.  Speech is clear and coherent with logical content.  Patient is alert and oriented at baseline.  Video failure on patient's end so unable to view oropharynx.  Assessment and Plan: 1. BV (bacterial vaginosis) - metroNIDAZOLE (METROGEL) 0.75 % vaginal gel; Place 1 Applicatorful vaginally at bedtime for 5 days.  Dispense: 50 g; Refill: 0  Classic symptoms. Metrogel per orders. Strict in person evaluation precautions reviewed.  2. Tonsillitis - predniSONE (DELTASONE) 10 MG tablet; Take 3 tablets (30 mg total) by mouth daily with breakfast for 3 days.  Dispense: 9 tablet; Refill: 0 - amoxicillin (AMOXIL) 500 MG tablet; Take 1 tablet (500 mg total) by mouth 2 (two) times daily for 10 days.  Dispense: 20 tablet; Refill: 0  Concern for strep giving constellation of symptoms and absence of URI symptoms. Supportive measures and OTC medications reviewed. Prednisone and Amoxicillin per orders.  Follow Up Instructions: I discussed the assessment and treatment plan with the  patient. The patient was provided an opportunity to ask questions and all were answered. The patient agreed with the plan and demonstrated an understanding of the instructions.  A copy of instructions were sent to the patient via MyChart unless otherwise noted below.   The patient was advised to call back or seek an in-person evaluation if the symptoms worsen or if the condition fails to improve as anticipated.    Lindsay Climes, PA-C

## 2023-08-27 NOTE — Patient Instructions (Signed)
Lindsay Dickerson, thank you for joining [name], PA-C for today's virtual visit.  While this provider is not your primary care provider (PCP), if your PCP is located in our provider database this encounter information will be shared with them immediately following your visit.   A [facility] MyChart account gives you access to today's visit and all your visits, tests, and labs performed at [facility] " click here if you don't have a [facility] MyChart account or go to [email or web address]  Consent: (Patient) Lindsay Dickerson provided verbal consent for this virtual visit at the beginning of the encounter.  Current Medications:  Current Outpatient Medications:    amoxicillin (AMOXIL) 500 MG tablet, Take 1 tablet (500 mg total) by mouth 2 (two) times daily for 10 days., Disp: 20 tablet, Rfl: 0   metroNIDAZOLE (METROGEL) 0.75 % vaginal gel, Place 1 Applicatorful vaginally at bedtime for 5 days., Disp: 50 g, Rfl: 0   predniSONE (DELTASONE) 10 MG tablet, Take 3 tablets (30 mg total) by mouth daily with breakfast for 3 days., Disp: 9 tablet, Rfl: 0   ALPRAZolam (XANAX) 0.25 MG tablet, Take 1 tablet (0.25 mg total) by mouth 2 (two) times daily as needed for anxiety., Disp: 60 tablet, Rfl: 5   atorvastatin (LIPITOR) 40 MG tablet, Take 1 tablet (40 mg total) by mouth daily at 6 PM. (NEEDS TO BE SEEN BEFORE NEXT REFILL), Disp: 90 tablet, Rfl: 1   fenofibrate (TRICOR) 145 MG tablet, Take 1 tablet (145 mg total) by mouth daily. (NEEDS TO BE SEEN BEFORE NEXT REFILL), Disp: 90 tablet, Rfl: 1   lisinopril-hydrochlorothiazide (ZESTORETIC) 20-12.5 MG tablet, Take 1 tablet by mouth daily. (NEEDS TO BE SEEN BEFORE NEXT REFILL), Disp: 90 tablet, Rfl: 1   QUEtiapine (SEROQUEL) 200 MG tablet, Take 1 tablet (200 mg total) by mouth at bedtime. (NEEDS TO BE SEEN BEFORE NEXT REFILL), Disp: 90 tablet, Rfl: 1   venlafaxine XR (EFFEXOR-XR) 75 MG 24 hr capsule, Take 1 capsule (75 mg total)  by mouth daily with breakfast. (NEEDS TO BE SEEN BEFORE NEXT REFILL), Disp: 90 capsule, Rfl: 1   Medications ordered in this encounter:  Meds ordered this encounter  Medications   predniSONE (DELTASONE) 10 MG tablet    Sig: Take 3 tablets (30 mg total) by mouth daily with breakfast for 3 days.    Dispense:  9 tablet    Refill:  0    Order Specific Question:   Supervising Provider    Answer:   Merrilee Jansky [[id number]]   amoxicillin (AMOXIL) 500 MG tablet    Sig: Take 1 tablet (500 mg total) by mouth 2 (two) times daily for 10 days.    Dispense:  20 tablet    Refill:  0    Order Specific Question:   Supervising Provider    Answer:   Merrilee Jansky [[id number]]   metroNIDAZOLE (METROGEL) 0.75 % vaginal gel    Sig: Place 1 Applicatorful vaginally at bedtime for 5 days.    Dispense:  50 g    Refill:  0    Order Specific Question:   Supervising Provider    Answer:   Merrilee Jansky [id number]     *If you need refills on other medications prior to your next appointment, please contact your pharmacy*  Follow-Up: Call back or seek an in-person evaluation if the symptoms worsen or if the condition fails to improve as anticipated.  [facility] [phone]  Other  Instructions Tonsillitis  Tonsillitis is an infection of the throat that causes the tonsils to become red, tender, and swollen. Tonsils are tissues in the back of your throat. Each tonsil has crevices (crypts). Tonsils normally work to protect the body from infection. What are the causes? Sudden (acute) tonsillitis may be caused by a virus or bacteria, including streptococcal bacteria. Long-lasting (chronic) tonsillitis occurs when the crypts of the tonsils become filled with pieces of food and bacteria, which makes it easy for the tonsils to become repeatedly infected. Tonsillitis can be spread from person to person when it is caused by a virus or bacteria. It may be spread by inhaling droplets that are  released with coughing or sneezing. You may also come into contact with viruses or bacteria on surfaces, such as cups or utensils. What are the signs or symptoms? Symptoms of this condition include: A sore throat. This may include trouble swallowing. White patches on the tonsils. Swollen tonsils. Fever. Headache. Tiredness. Loss of appetite. Snoring during sleep when you did not snore before. Small, foul-smelling, yellowish-white pieces of material (tonsilloliths) that you occasionally cough up or spit out. These can cause you to have bad breath. How is this diagnosed? This condition is diagnosed with a physical exam. Diagnosis can be confirmed with the results of lab tests, including a throat culture. How is this treated? Treatment for this condition depends on the cause, but usually focuses on treating the symptoms associated with it. Treatment may include: Medicines to relieve pain and manage fever. Steroid medicines to reduce swelling. Antibiotic medicines if the condition is caused by bacteria. If episodes of tonsillitis are severe and frequent, your health care provider may recommend surgery to remove the tonsils (tonsillectomy). Follow these instructions at home: Medicines Take over-the-counter and prescription medicines only as told by your health care provider. If you were prescribed an antibiotic medicine, take it as told by your health care provider. Do not stop taking the antibiotic even if you start to feel better. Eating and drinking Drink enough fluid to keep your urine pale yellow. While your throat is sore, eat soft or liquid foods, such as sherbet, soups, or soft, warm cereals, such as oatmeal or hot wheat cereal. Drink warm liquids. Eat frozen ice pops. General instructions Rest as much as possible and get plenty of sleep. Gargle with a mixture of salt and water 3-4 times a day or as needed. To make salt water, completely dissolve -1 tsp (3-6 g) of salt in 1 cup  (237 mL) of warm water. Do not swallow the mixture of salt and water. Wash your hands regularly with soap and water for at least 20 seconds. If soap and water are not available, use hand sanitizer. Do not share cups, bottles, or other utensils until your symptoms have gone away. Do not use any products that contain nicotine or tobacco. These products include cigarettes, chewing tobacco, and vaping devices, such as e-cigarettes. If you need help quitting, ask your health care provider. Keep all follow-up visits. This is important. Contact a health care provider if: You notice large, tender lumps in your neck that were not there before. You have a fever that does not go away after 2-3 days. You develop a rash. You cough up a green, yellow-brown, or bloody substance. You cannot swallow liquids or food for 24 hours. Only one of your tonsils is swollen. Get help right away if: You develop any new symptoms, such as vomiting, severe headache, stiff neck, chest pain, trouble  breathing, or trouble swallowing. You have severe throat pain along with drooling or voice changes. You have severe pain that is not controlled with medicines. You cannot fully open your mouth. You develop redness, swelling, or severe pain anywhere in your neck. Summary Tonsillitis is an infection of the throat that causes the tonsils to become red, tender, and swollen. The most common symptom is pain in the throat. Tonsillitis is most often caused by a virus or bacteria. Get help right away if you develop any new symptoms, such as vomiting, severe headache, stiff neck, chest pain, or trouble breathing. This information is not intended to replace advice given to you by your health care provider. Make sure you discuss any questions you have with your health care provider. Document Revised: 02/01/2021 Document Reviewed: 02/01/2021 Elsevier Patient Education  2024 Elsevier Inc.    If you have been instructed to have an  in-person evaluation today at a local Urgent Care facility, please use the link below. It will take you to a list of all of our available [facility], including address, phone number and hours of operation. Please do not delay care.  [facility]  If you or a family member do not have a primary care provider, use the link below to schedule a visit and establish care. When you choose a [facility] primary care physician or advanced practice provider, you gain a long-term partner in health. Find a Primary Care Provider  Learn more about [facility] in-office and virtual care options: [facility] - Get Care Now

## 2023-09-08 ENCOUNTER — Ambulatory Visit: Payer: Self-pay | Admitting: Nurse Practitioner

## 2023-09-08 NOTE — Telephone Encounter (Signed)
  Chief Complaint: Vaginal itching/discharge Symptoms: Vaginal itching, white vaginal discharge Frequency: Constant Pertinent Negatives: Patient denies fevers, abdominal pain Disposition: [] ED /[] Urgent Care (no appt availability in office) / [] Appointment(In office/virtual)/ []  [facility]/ [] Home Care/ [x] Refused Recommended Disposition /[] [facility]/ [x]  Follow-up with PCP Additional Notes: Patient reports she finished taking an antibiotic on Saturday and began to experience vaginal itching with white discharge. She reports she usually gets yeast infections after finishing antibiotics and believes she has one now. Per protocol this RN recommended scheduling an appointment. Patient states she does not want to be seen in the office and is requesting a message to be sent to her PCP for a prescription for her yeast infection. This RN advised to call back with worsening symptoms. She verbalized understanding of this.   Patient has yeast infection, experiencing itching and extreme discharge, with burning during urination after finishing antiobiotics    Reason for Disposition  [1] Symptoms of a "yeast infection" (i.e., itchy, white discharge, not bad smelling) AND [2] not improved > 3 days following Care Advice  Answer Assessment - Initial Assessment Questions 1. DISCHARGE: "Describe the discharge." (e.g., white, yellow, green, gray, foamy, cottage cheese-like)     White discharge 2. ODOR: "Is there a bad odor?"     No odor 3. ONSET: "When did the discharge begin?"     Yesterday after finishing an antibiotic 4. RASH: "Is there a rash in the genital area?" If Yes, ask: "Describe it." (e.g., redness, blisters, sores, bumps)     No rash 5. ABDOMEN PAIN: "Are you having any abdomen pain?" If Yes, ask: "What does it feel like? " (e.g., crampy, dull, intermittent, constant)      No 6. ABDOMEN PAIN SEVERITY: If present, ask: "How bad is it?" (e.g., Scale 1-10; mild, moderate, or  severe)   - MILD (1-3): Doesn't interfere with normal activities, abdomen soft and not tender to touch.    - MODERATE (4-7): Interferes with normal activities or awakens from sleep, abdomen tender to touch.    - SEVERE (8-10): Excruciating pain, doubled over, unable to do any normal activities. (R/O peritonitis)      N/A 7. CAUSE: "What do you think is causing the discharge?" "Have you had the same problem before? What happened then?"     Finished antibiotic, usually gets a yeast infection after 8. OTHER SYMPTOMS: "Do you have any other symptoms?" (e.g., fever, itching, vaginal bleeding, pain with urination, injury to genital area, vaginal foreign body)     Vaginal itching 9. PREGNANCY: "Is there any chance you are pregnant?" "When was your last menstrual period?"     No  Protocols used: Vaginal Discharge-A-AH

## 2023-09-09 MED ORDER — FLUCONAZOLE 150 MG PO TABS: 150.0000 mg | ORAL_TABLET | Freq: Once | ORAL | 0 refills | Status: AC

## 2023-09-09 NOTE — Telephone Encounter (Signed)
Patient notified and verbalized understanding. 

## 2023-09-09 NOTE — Addendum Note (Signed)
Addended by: Bennie Pierini on: 09/09/2023 11:59 AM   Modules accepted: Orders

## 2023-09-24 DIAGNOSIS — Z419 Encounter for procedure for purposes other than remedying health state, unspecified: Secondary | ICD-10-CM | POA: Diagnosis not present

## 2023-10-14 ENCOUNTER — Encounter: Payer: Medicaid Other | Admitting: Nurse Practitioner

## 2023-10-16 ENCOUNTER — Ambulatory Visit: Payer: Medicaid Other | Admitting: Nurse Practitioner

## 2023-10-16 ENCOUNTER — Encounter: Payer: Self-pay | Admitting: Nurse Practitioner

## 2023-10-16 ENCOUNTER — Other Ambulatory Visit (HOSPITAL_COMMUNITY)
Admission: RE | Admit: 2023-10-16 | Discharge: 2023-10-16 | Disposition: A | Discharge status: Home / Self Care | Payer: Medicaid Other | Source: Ambulatory Visit | Attending: Nurse Practitioner | Admitting: Nurse Practitioner

## 2023-10-16 VITALS — BP 115/78 | HR 77 | Temp 97.4°F | Ht 62.0 in | Wt 124.0 lb

## 2023-10-16 DIAGNOSIS — Z0000 Encounter for general adult medical examination without abnormal findings: Secondary | ICD-10-CM

## 2023-10-16 DIAGNOSIS — F319 Bipolar disorder, unspecified: Secondary | ICD-10-CM

## 2023-10-16 DIAGNOSIS — R7989 Other specified abnormal findings of blood chemistry: Secondary | ICD-10-CM | POA: Diagnosis not present

## 2023-10-16 DIAGNOSIS — Z0001 Encounter for general adult medical examination with abnormal findings: Secondary | ICD-10-CM

## 2023-10-16 DIAGNOSIS — E785 Hyperlipidemia, unspecified: Secondary | ICD-10-CM

## 2023-10-16 DIAGNOSIS — I10 Essential (primary) hypertension: Secondary | ICD-10-CM

## 2023-10-16 DIAGNOSIS — F17200 Nicotine dependence, unspecified, uncomplicated: Secondary | ICD-10-CM

## 2023-10-16 DIAGNOSIS — F332 Major depressive disorder, recurrent severe without psychotic features: Secondary | ICD-10-CM | POA: Diagnosis not present

## 2023-10-16 MED ORDER — QUETIAPINE FUMARATE 200 MG PO TABS
200.0000 mg | ORAL_TABLET | Freq: Every day | ORAL | 1 refills | Status: DC
Start: 2023-10-16 — End: 2024-06-17

## 2023-10-16 MED ORDER — FENOFIBRATE 145 MG PO TABS: 145.0000 mg | ORAL_TABLET | Freq: Every day | ORAL | 1 refills | Status: DC

## 2023-10-16 MED ORDER — LISINOPRIL-HYDROCHLOROTHIAZIDE 20-12.5 MG PO TABS: 1.0000 | ORAL_TABLET | Freq: Every day | ORAL | 1 refills | Status: DC

## 2023-10-16 MED ORDER — VENLAFAXINE HCL ER 75 MG PO CP24: 75.0000 mg | ORAL_CAPSULE | Freq: Every day | ORAL | 1 refills | Status: DC

## 2023-10-16 MED ORDER — ATORVASTATIN CALCIUM 40 MG PO TABS: 40.0000 mg | ORAL_TABLET | Freq: Every day | ORAL | 1 refills | Status: DC

## 2023-10-16 MED ORDER — ALPRAZOLAM 0.25 MG PO TABS: 0.2500 mg | ORAL_TABLET | Freq: Two times a day (BID) | ORAL | 5 refills | Status: AC | PRN

## 2023-10-16 NOTE — Addendum Note (Signed)
Addended by: Bennie Pierini on: 10/16/2023 11:52 AM   Modules accepted: Orders

## 2023-10-16 NOTE — Progress Notes (Signed)
Subjective:    Patient ID: Lindsay Dickerson, female    DOB: [birth year], 53 y.o.   MRN: [id number]   Chief Complaint: annual physical     HPI:  Lindsay Dickerson is a 53 y.o. who identifies as a female who was assigned female at birth.   Social history: Lives with: boyfriend Work history: works at [employer] in today for follow up of the following chronic medical issues:  1. Primary hypertension No c/o chest pain, sob or headache. Does not check blood pressure at home. BP Readings from Last 3 Encounters:  10/16/23 115/78  04/10/23 106/69  09/30/22 110/71     2. Hyperlipidemia with target LDL less than 100 Does not really watch diet. Does no exercise. Lab Results  Component Value Date   CHOL 102 04/10/2023   HDL 11 (L) 04/10/2023   LDLCALC 67 04/10/2023   TRIG 132 04/10/2023   CHOLHDL 9.3 (H) 04/10/2023     3. Bipolar 1 disorder (HCC) 4. Severe episode of recurrent major depressive disorder, without psychotic features (HCC) Is on effexor, seroquel and xanax combination. Says she is doing well.    10/16/2023   11:17 AM 04/10/2023   10:59 AM 09/30/2022    8:37 AM  Depression screen PHQ 2/9  Decreased Interest 1 2 2   Down, Depressed, Hopeless 1 2 2   PHQ - 2 Score 2 4 4   Altered sleeping 1 1 1   Tired, decreased energy 1 1 1   Change in appetite 1 1 1   Feeling bad or failure about yourself  1 0 0  Trouble concentrating 1 1 1   Moving slowly or fidgety/restless 0 0 0  Suicidal thoughts 0 0 0  PHQ-9 Score 7 8 8   Difficult doing work/chores Not difficult at all Not difficult at all Somewhat difficult      10/16/2023   11:17 AM 04/10/2023   10:59 AM 09/30/2022    8:37 AM 12/27/2021   11:24 AM  GAD 7 : Generalized Anxiety Score  Nervous, Anxious, on Edge 1 2 2 1   Control/stop worrying 1 2 2 2   Worry too much - different things 1 2 2 2   Trouble relaxing 1 2 2  0  Restless 1 0 0 1  Easily annoyed or irritable 1 1 1 2   Afraid - awful might happen 1  0 0 0  Total GAD 7 Score 7 9 9 8   Anxiety Difficulty Somewhat difficult Somewhat difficult Somewhat difficult Somewhat difficult      New complaints: None today  Allergies  Allergen Reactions   Erythromycin Anaphylaxis   Zithromax [Azithromycin] Anaphylaxis and Swelling    Throat started closing up   Levaquin [Levofloxacin In D5w]    Outpatient Encounter Medications as of 10/16/2023  Medication Sig   atorvastatin (LIPITOR) 40 MG tablet Take 1 tablet (40 mg total) by mouth daily at 6 PM. (NEEDS TO BE SEEN BEFORE NEXT REFILL)   fenofibrate (TRICOR) 145 MG tablet Take 1 tablet (145 mg total) by mouth daily. (NEEDS TO BE SEEN BEFORE NEXT REFILL)   lisinopril-hydrochlorothiazide (ZESTORETIC) 20-12.5 MG tablet Take 1 tablet by mouth daily. (NEEDS TO BE SEEN BEFORE NEXT REFILL)   QUEtiapine (SEROQUEL) 200 MG tablet Take 1 tablet (200 mg total) by mouth at bedtime. (NEEDS TO BE SEEN BEFORE NEXT REFILL)   venlafaxine XR (EFFEXOR-XR) 75 MG 24 hr capsule Take 1 capsule (75 mg total) by mouth daily with breakfast. (NEEDS TO BE SEEN BEFORE NEXT REFILL)  ALPRAZolam (XANAX) 0.25 MG tablet Take 1 tablet (0.25 mg total) by mouth 2 (two) times daily as needed for anxiety. (Patient not taking: Reported on 10/16/2023)   No facility-administered encounter medications on file as of 10/16/2023.    Past Surgical History:  Procedure Laterality Date   ABDOMINAL HYSTERECTOMY     partial, rt bso   APPENDECTOMY     BREAST SURGERY     EYE SURGERY     NASAL SEPTUM SURGERY     THYROIDECTOMY, PARTIAL      Family History  Problem Relation Age of Onset   Hypertension Mother    Hyperlipidemia Father    Hypertension Father       Controlled substance contract: n/a     Review of Systems  Constitutional:  Negative for diaphoresis.  Eyes:  Negative for pain.  Respiratory:  Negative for shortness of breath.   Cardiovascular:  Negative for chest pain, palpitations and leg swelling.  Gastrointestinal:   Negative for abdominal pain.  Endocrine: Negative for polydipsia.  Skin:  Negative for rash.  Neurological:  Negative for dizziness, weakness and headaches.  Hematological:  Does not bruise/bleed easily.  All other systems reviewed and are negative.      Objective:   Physical Exam Vitals and nursing note reviewed.  Constitutional:      General: She is not in acute distress.    Appearance: Normal appearance. She is well-developed.  HENT:     Head: Normocephalic.     Right Ear: Tympanic membrane normal.     Left Ear: Tympanic membrane normal.     Nose: Nose normal.     Mouth/Throat:     Mouth: Mucous membranes are moist.  Eyes:     Pupils: Pupils are equal, round, and reactive to light.  Neck:     Vascular: No carotid bruit or JVD.  Cardiovascular:     Rate and Rhythm: Normal rate and regular rhythm.     Heart sounds: Normal heart sounds.  Pulmonary:     Effort: Pulmonary effort is normal. No respiratory distress.     Breath sounds: Normal breath sounds. No wheezing or rales.  Chest:     Chest wall: No tenderness.  Abdominal:     General: Bowel sounds are normal. There is no distension or abdominal bruit.     Palpations: Abdomen is soft. There is no hepatomegaly, splenomegaly, mass or pulsatile mass.     Tenderness: There is no abdominal tenderness.  Genitourinary:    General: Normal vulva.     Vagina: No vaginal discharge.     Rectum: Normal.     Comments: Vaginal cuff intact No adnexal masses or tenderness Musculoskeletal:        General: Normal range of motion.     Cervical back: Normal range of motion and neck supple.  Lymphadenopathy:     Cervical: No cervical adenopathy.  Skin:    General: Skin is warm and dry.  Neurological:     Mental Status: She is alert and oriented to person, place, and time.     Deep Tendon Reflexes: Reflexes are normal and symmetric.  Psychiatric:        Behavior: Behavior normal.        Thought Content: Thought content normal.         Judgment: Judgment normal.    BP 115/78   Pulse 77   Temp (!) 97.4 F (36.3 C) (Temporal)   Ht 5\' 2"  (1.575 m)   Wt 124 lb (  56.2 kg)   SpO2 97%   BMI 22.68 kg/m         Assessment & Plan:   Lindsay Dickerson comes in today with chief complaint of Annual Exam   Diagnosis and orders addressed:  1. Annual physical   2. Primary hypertension Low sodium diet - lisinopril-hydrochlorothiazide (ZESTORETIC) 20-12.5 MG tablet; Take 1 tablet by mouth daily. (NEEDS TO BE SEEN BEFORE NEXT REFILL)  Dispense: 90 tablet; Refill: 1 - CBC with Differential/Platelet - CMP14+EGFR  3. Hyperlipidemia with target LDL less than 100 Low fat diet - atorvastatin (LIPITOR) 40 MG tablet; Take 1 tablet (40 mg total) by mouth daily at 6 PM. (NEEDS TO BE SEEN BEFORE NEXT REFILL)  Dispense: 90 tablet; Refill: 1 - fenofibrate (TRICOR) 145 MG tablet; Take 1 tablet (145 mg total) by mouth daily. (NEEDS TO BE SEEN BEFORE NEXT REFILL)  Dispense: 90 tablet; Refill: 1 - Lipid panel  4. Bipolar 1 disorder (HCC) - ALPRAZolam (XANAX) 0.25 MG tablet; Take 1 tablet (0.25 mg total) by mouth 2 (two) times daily as needed for anxiety.  Dispense: 60 tablet; Refill: 5 - QUEtiapine (SEROQUEL) 200 MG tablet; Take 1 tablet (200 mg total) by mouth at bedtime. (NEEDS TO BE SEEN BEFORE NEXT REFILL)  Dispense: 90 tablet; Refill: 1  5. Severe episode of recurrent major depressive disorder, without psychotic features (HCC) Stress management - venlafaxine XR (EFFEXOR-XR) 75 MG 24 hr capsule; Take 1 capsule (75 mg total) by mouth daily with breakfast. (NEEDS TO BE SEEN BEFORE NEXT REFILL)  Dispense: 90 capsule; Refill: 1  6. Encounter for mammogram to establish baseline mammogram - MM 3D DIAGNOSTIC MAMMOGRAM BILATERAL BREAST W/IMPLANT   Labs pending Health Maintenance reviewed Diet and exercise encouraged  Follow up plan: 6 months   Mary-Margaret Daphine Deutscher, FNP

## 2023-10-16 NOTE — Patient Instructions (Signed)
Exercising to Stay Healthy To become healthy and stay healthy, it is recommended that you do moderate-intensity and vigorous-intensity exercise. You can tell that you are exercising at a moderate intensity if your heart starts beating faster and you start breathing faster but can still hold a conversation. You can tell that you are exercising at a vigorous intensity if you are breathing much harder and faster and cannot hold a conversation while exercising. How can exercise benefit me? Exercising regularly is important. It has many health benefits, such as: Improving overall fitness, flexibility, and endurance. Increasing bone density. Helping with weight control. Decreasing body fat. Increasing muscle strength and endurance. Reducing stress and tension, anxiety, depression, or anger. Improving overall health. What guidelines should I follow while exercising? Before you start a new exercise program, talk with your health care provider. Do not exercise so much that you hurt yourself, feel dizzy, or get very short of breath. Wear comfortable clothes and wear shoes with good support. Drink plenty of water while you exercise to prevent dehydration or heat stroke. Work out until your breathing and your heartbeat get faster (moderate intensity). How often should I exercise? Choose an activity that you enjoy, and set realistic goals. Your health care provider can help you make an activity plan that is individually designed and works best for you. Exercise regularly as told by your health care provider. This may include: Doing strength training two times a week, such as: Lifting weights. Using resistance bands. Push-ups. Sit-ups. Yoga. Doing a certain intensity of exercise for a given amount of time. Choose from these options: A total of 150 minutes of moderate-intensity exercise every week. A total of 75 minutes of vigorous-intensity exercise every week. A mix of moderate-intensity and  vigorous-intensity exercise every week. Children, pregnant women, people who have not exercised regularly, people who are overweight, and older adults may need to talk with a health care provider about what activities are safe to perform. If you have a medical condition, be sure to talk with your health care provider before you start a new exercise program. What are some exercise ideas? Moderate-intensity exercise ideas include: Walking 1 mile (1.6 km) in about 15 minutes. Biking. Hiking. Golfing. Dancing. Water aerobics. Vigorous-intensity exercise ideas include: Walking 4.5 miles (7.2 km) or more in about 1 hour. Jogging or running 5 miles (8 km) in about 1 hour. Biking 10 miles (16.1 km) or more in about 1 hour. Lap swimming. Roller-skating or in-line skating. Cross-country skiing. Vigorous competitive sports, such as football, basketball, and soccer. Jumping rope. Aerobic dancing. What are some everyday activities that can help me get exercise? Yard work, such as: Pushing a lawn mower. Raking and bagging leaves. Washing your car. Pushing a stroller. Shoveling snow. Gardening. Washing windows or floors. How can I be more active in my day-to-day activities? Use stairs instead of an elevator. Take a walk during your lunch break. If you drive, park your car farther away from your work or school. If you take public transportation, get off one stop early and walk the rest of the way. Stand up or walk around during all of your indoor phone calls. Get up, stretch, and walk around every 30 minutes throughout the day. Enjoy exercise with a friend. Support to continue exercising will help you keep a regular routine of activity. Where to find more information You can find more information about exercising to stay healthy from: [place] Department of Health and Human Services: www.hhs.gov Centers for Disease Control and Prevention (  CDC): www.cdc.gov Summary Exercising regularly is  important. It will improve your overall fitness, flexibility, and endurance. Regular exercise will also improve your overall health. It can help you control your weight, reduce stress, and improve your bone density. Do not exercise so much that you hurt yourself, feel dizzy, or get very short of breath. Before you start a new exercise program, talk with your health care provider. This information is not intended to replace advice given to you by your health care provider. Make sure you discuss any questions you have with your health care provider. Document Revised: 01/05/2021 Document Reviewed: 01/05/2021 Elsevier Patient Education  2024 [employer]  

## 2023-10-17 LAB — CMP14+EGFR
ALT: 9 [IU]/L (ref 0–32)
AST: 25 [IU]/L (ref 0–40)
Albumin: 4.5 g/dL (ref 3.8–4.9)
Alkaline Phosphatase: 54 [IU]/L (ref 44–121)
BUN/Creatinine Ratio: 21 (ref 9–23)
BUN: 16 mg/dL (ref 6–24)
Bilirubin Total: 0.3 mg/dL (ref 0.0–1.2)
CO2: 23 mmol/L (ref 20–29)
Calcium: 10 mg/dL (ref 8.7–10.2)
Chloride: 99 mmol/L (ref 96–106)
Creatinine, Ser: 0.78 mg/dL (ref 0.57–1.00)
Globulin, Total: 3 g/dL (ref 1.5–4.5)
Glucose: 123 mg/dL — ABNORMAL HIGH (ref 70–99)
Potassium: 4.4 mmol/L (ref 3.5–5.2)
Sodium: 136 mmol/L (ref 134–144)
Total Protein: 7.5 g/dL (ref 6.0–8.5)
eGFR: 90 mL/min/{1.73_m2} (ref >59)

## 2023-10-17 LAB — CBC WITH DIFFERENTIAL/PLATELET
Basophils Absolute: 0.1 10*3/uL (ref 0.0–0.2)
Basos: 1 %
EOS (ABSOLUTE): 0.3 10*3/uL (ref 0.0–0.4)
Eos: 4 %
Hematocrit: 39.7 % (ref 34.0–46.6)
Hemoglobin: 13.6 g/dL (ref 11.1–15.9)
Immature Grans (Abs): 0 10*3/uL (ref 0.0–0.1)
Immature Granulocytes: 0 %
Lymphocytes Absolute: 2.6 10*3/uL (ref 0.7–3.1)
Lymphs: 36 %
MCH: 31.1 pg (ref 26.6–33.0)
MCHC: 34.3 g/dL (ref 31.5–35.7)
MCV: 91 fL (ref 79–97)
Monocytes Absolute: 0.5 10*3/uL (ref 0.1–0.9)
Monocytes: 7 %
Neutrophils Absolute: 3.7 10*3/uL (ref 1.4–7.0)
Neutrophils: 52 %
Platelets: 297 10*3/uL (ref 150–450)
RBC: 4.38 x10E6/uL (ref 3.77–5.28)
RDW: 12.9 % (ref 11.7–15.4)
WBC: 7.2 10*3/uL (ref 3.4–10.8)

## 2023-10-17 LAB — THYROID PANEL WITH TSH
Free Thyroxine Index: 1.9 (ref 1.2–4.9)
T3 Uptake Ratio: 25 % (ref 24–39)
T4, Total: 7.6 ug/dL (ref 4.5–12.0)
TSH: <0.005 u[IU]/mL — ABNORMAL LOW (ref 0.450–4.500)

## 2023-10-17 LAB — LIPID PANEL
Chol/HDL Ratio: 11.2 {ratio} — ABNORMAL HIGH (ref 0.0–4.4)
Cholesterol, Total: 123 mg/dL (ref 100–199)
HDL: 11 mg/dL — ABNORMAL LOW (ref >39)
LDL Chol Calc (NIH): 85 mg/dL (ref 0–99)
Triglycerides: 153 mg/dL — ABNORMAL HIGH (ref 0–149)
VLDL Cholesterol Cal: 27 mg/dL (ref 5–40)

## 2023-10-17 LAB — VITAMIN D 25 HYDROXY (VIT D DEFICIENCY, FRACTURES): Vit D, 25-Hydroxy: 15.7 ng/mL — ABNORMAL LOW (ref 30.0–100.0)

## 2023-10-17 NOTE — Addendum Note (Signed)
Addended by: Bennie Pierini on: 10/17/2023 07:59 AM   Modules accepted: Orders

## 2023-10-22 LAB — CYTOLOGY - PAP: Diagnosis: NEGATIVE

## 2023-10-25 DIAGNOSIS — Z419 Encounter for procedure for purposes other than remedying health state, unspecified: Secondary | ICD-10-CM | POA: Diagnosis not present

## 2023-11-21 ENCOUNTER — Telehealth: Payer: Self-pay | Admitting: Nurse Practitioner

## 2023-11-21 NOTE — Telephone Encounter (Signed)
 Pt called stating that she got a call from the Endo office that we referred her to, and she has an appt in May because that's how far they are booked out. Wants to know if we can find another office for her to be referred to that can get her in much quicker? She says she used to see someone in [place] a long time ago when she had thyroid surgery but is unsure of name of doctor or place (maybe Dr Talmage Nap). If so, she wonders if she could be sent there again if they are able to see her much sooner than May?   Please advise and call patient with update.

## 2023-11-22 DIAGNOSIS — Z419 Encounter for procedure for purposes other than remedying health state, unspecified: Secondary | ICD-10-CM | POA: Diagnosis not present

## 2023-11-26 NOTE — Telephone Encounter (Signed)
 L/M for New Patient Coordinator at Dr. Willeen Cass Office to inquire Appt scheduling availability.

## 2023-12-12 ENCOUNTER — Encounter: Payer: Self-pay | Admitting: Emergency Medicine

## 2024-01-03 DIAGNOSIS — Z419 Encounter for procedure for purposes other than remedying health state, unspecified: Secondary | ICD-10-CM | POA: Diagnosis not present

## 2024-02-02 DIAGNOSIS — Z419 Encounter for procedure for purposes other than remedying health state, unspecified: Secondary | ICD-10-CM | POA: Diagnosis not present

## 2024-02-05 NOTE — Patient Instructions (Signed)
 Thyroid-Stimulating Hormone Test Why am I having this test? The thyroid is a gland in the lower front of the neck. It makes hormones that affect many body parts and systems, including the system that affects how quickly the body burns fuel for energy (metabolism). The pituitary gland is located just below the brain, behind the eyes and nasal passages. It helps maintain thyroid hormone levels and thyroid gland function. You may have a thyroid-stimulating hormone (TSH) test if you have possible symptoms of abnormal thyroid hormone levels. This test can help your health care provider: Diagnose a disorder of the thyroid gland or pituitary gland. Manage your condition and treatment if you have an underactive thyroid (hypothyroidism) or an overactive thyroid (hyperthyroidism). Newborn babies may have this test done to screen for hypothyroidism that is present at birth (congenital). What is being tested? This test measures the amount of TSH in your blood. TSH may also be called thyrotropin. When the thyroid does not make enough hormones, the pituitary gland releases TSH into the bloodstream to stimulate the thyroid gland to make more hormones. What kind of sample is taken?     A blood sample is required for this test. It is usually collected by inserting a needle into a blood vessel. For newborns, a small amount of blood may be collected from the umbilical cord, or by using a small needle to prick the baby's heel (heel stick). Tell a health care provider about: All medicines you are taking, including vitamins, herbs, eye drops, creams, and over-the-counter medicines. Any bleeding problems you have. Any surgeries you have had. Any medical conditions you have. Whether you are pregnant or may be pregnant. How are the results reported? Your test results will be reported as a value that indicates how much TSH is in your blood. Your health care provider will compare your results to normal ranges that were  established after testing a large group of people (reference ranges). Reference ranges may vary among labs and hospitals. For this test, common reference ranges are: Adult: 2-10 microunits/mL or 2-10 milliunits/L. Newborn: Heel stick: 3-18 microunits/mL or 3-18 milliunits/L. Umbilical cord: 3-12 microunits/mL or 3-12 milliunits/L. What do the results mean? Results that are within the reference range are considered normal. This means that you have a normal amount of TSH in your blood. Results that are higher than the reference range mean that your TSH levels are too high. This may mean: Your thyroid gland is not making enough thyroid hormones. Your thyroid medicine dosage is too low. You have a tumor on your pituitary gland. This is rare. Results that are lower than the reference range mean that your TSH levels are too low. This may be caused by hyperthyroidism or by a problem with the pituitary gland function. Talk with your health care provider about what your results mean. Questions to ask your health care provider Ask your health care provider, or the department that is doing the test: When will my results be ready? How will I get my results? What are my treatment options? What other tests do I need? What are my next steps? Summary You may have a thyroid-stimulating hormone (TSH) test if you have possible symptoms of abnormal thyroid hormone levels. The thyroid is a gland in the lower front of the neck. It makes hormones that affect many body parts and systems. The pituitary gland is located just below the brain, behind the eyes and nasal passages. It helps maintain thyroid hormone levels and thyroid gland function. This test  measures the amount of TSH in your blood. TSH is made by the pituitary gland. It may also be called thyrotropin. This information is not intended to replace advice given to you by your health care provider. Make sure you discuss any questions you have with your  health care provider. Document Revised: 09/11/2021 Document Reviewed: 09/11/2021 Elsevier Patient Education  2024 [employer].

## 2024-02-12 ENCOUNTER — Ambulatory Visit (INDEPENDENT_AMBULATORY_CARE_PROVIDER_SITE_OTHER): Payer: Medicaid Other | Admitting: Nurse Practitioner

## 2024-02-12 ENCOUNTER — Encounter: Payer: Self-pay | Admitting: Nurse Practitioner

## 2024-02-12 VITALS — BP 102/62 | HR 85 | Ht 66.0 in | Wt 122.4 lb

## 2024-02-12 DIAGNOSIS — E041 Nontoxic single thyroid nodule: Secondary | ICD-10-CM

## 2024-02-12 DIAGNOSIS — E890 Postprocedural hypothyroidism: Secondary | ICD-10-CM

## 2024-02-12 DIAGNOSIS — R7989 Other specified abnormal findings of blood chemistry: Secondary | ICD-10-CM

## 2024-02-12 NOTE — Progress Notes (Signed)
 02/12/2024     Endocrinology Consult Note    Subjective:    Patient ID: Lindsay Dickerson, female    DOB: 1971-09-18, PCP Lindsay Feller, Lindsay Dickerson.   Past Medical History:  Diagnosis Date   Depression    Hyperlipidemia    Hypertension     Past Surgical History:  Procedure Laterality Date   ABDOMINAL HYSTERECTOMY     partial, rt bso   APPENDECTOMY     BREAST SURGERY     EYE SURGERY     NASAL SEPTUM SURGERY     THYROIDECTOMY, PARTIAL      Social History   Socioeconomic History   Marital status: Single    Spouse name: Not on file   Number of children: Not on file   Years of education: Not on file   Highest education level: Not on file  Occupational History   Not on file  Tobacco Use   Smoking status: Some Days    Current packs/day: 0.00    Types: Cigarettes    Last attempt to quit: 10/27/2008    Years since quitting: 15.3   Smokeless tobacco: Never  Substance and Sexual Activity   Alcohol use: Yes   Drug use: Yes    Types: Marijuana   Sexual activity: Not on file  Other Topics Concern   Not on file  Social History Narrative   Not on file   Social Drivers of Health   Financial Resource Strain: Not on file  Food Insecurity: Not on file  Transportation Needs: Not on file  Physical Activity: Not on file  Stress: Not on file  Social Connections: Unknown (02/04/2022)   Received from [facility], [facility]   Social Network    Social Network: Not on file    Family History  Problem Relation Age of Onset   Hypertension Mother    Hyperlipidemia Father    Hypertension Father     Outpatient Encounter Medications as of 02/12/2024  Medication Sig   ALPRAZolam  (XANAX ) 0.25 MG tablet Take 1 tablet (0.25 mg total) by mouth 2 (two) times daily as needed for anxiety.   atorvastatin  (LIPITOR) 40 MG tablet Take 1 tablet (40 mg total) by mouth daily at 6 PM. (NEEDS TO BE SEEN BEFORE NEXT REFILL)   fenofibrate  (TRICOR ) 145 MG tablet Take 1 tablet  (145 mg total) by mouth daily. (NEEDS TO BE SEEN BEFORE NEXT REFILL)   lisinopril -hydrochlorothiazide  (ZESTORETIC ) 20-12.5 MG tablet Take 1 tablet by mouth daily. (NEEDS TO BE SEEN BEFORE NEXT REFILL)   QUEtiapine  (SEROQUEL ) 200 MG tablet Take 1 tablet (200 mg total) by mouth at bedtime. (NEEDS TO BE SEEN BEFORE NEXT REFILL)   venlafaxine  XR (EFFEXOR -XR) 75 MG 24 hr capsule Take 1 capsule (75 mg total) by mouth daily with breakfast. (NEEDS TO BE SEEN BEFORE NEXT REFILL)   No facility-administered encounter medications on file as of 02/12/2024.    ALLERGIES: Allergies  Allergen Reactions   Erythromycin Anaphylaxis   Zithromax [Azithromycin] Anaphylaxis and Swelling    Throat started closing up   Levaquin [Levofloxacin In D5w]     VACCINATION STATUS: Immunization History  Administered Date(s) Administered   Influenza,inj,Quad PF,6+ Mos 10/05/2014, 07/25/2017   Moderna Sars-Covid-2 Vaccination 12/28/2019, 01/25/2020   Tdap 10/05/2014     HPI  VICKEY BOAK is 53 y.o. female who presents today with a medical history as above. she is being seen in consultation for hyperthyroidism requested by Lindsay Feller, Lindsay Dickerson.  she has been  dealing with symptoms of hot flashes, unexplained weight loss (attributes this to recent break up), palpitations and tremors for about 2-3 months. These symptoms are progressively worsening and troubling to her.  her most recent thyroid  labs revealed suppressed TSH of < 0.005 on 10/16/23.  This was found during routine annual physical.  she denies dysphagia, choking, shortness of breath, no recent voice change.    she does have extensive family history of thyroid  dysfunction in her dad and sister (both hyperactive requiring RAI ablation), but denies family hx of thyroid  cancer. she denies personal history of goiter. she is not on any anti-thyroid  medications nor on any thyroid  hormone supplements. Denies use of Biotin containing supplements.  she is  willing to proceed with appropriate work up and therapy for thyrotoxicosis.  She was previously seen by Dr. Balan for thyroid  problems thyrotoxicosis and nodules dating back to 2010.  Ultimately she did have partial thyroidectomy as well as biopsy favoring benignity.  Records from his office are not available to review.  She never needed any thyroid  hormone replacement after her surgery.   Review of systems  Constitutional: + decreasing body weight, current Body mass index is 19.76 kg/m., no fatigue, + subjective hyperthermia, no subjective hypothermia Eyes: no blurry vision, no xerophthalmia ENT: no sore throat, no nodules palpated in throat, no dysphagia/odynophagia, no hoarseness Cardiovascular: no chest pain, no shortness of breath, + palpitations, no leg swelling Respiratory: no cough, no shortness of breath Gastrointestinal: no nausea/vomiting/diarrhea Musculoskeletal: no muscle/joint aches Skin: no rashes, no hyperemia Neurological: + tremors (attributes this to coffee consumption on empty stomach), no numbness, no tingling, no dizziness Psychiatric: no depression, + anxiety- controlled on meds   Objective:     BP 102/62 (BP Location: Right Arm, Patient Position: Sitting, Cuff Size: Large)   Pulse 85   Ht 5\' 6"  (1.676 m)   Wt 122 lb 6.4 oz (55.5 kg)   BMI 19.76 kg/m   Wt Readings from Last 3 Encounters:  02/12/24 122 lb 6.4 oz (55.5 kg)  10/16/23 124 lb (56.2 kg)  04/10/23 122 lb (55.3 kg)     BP Readings from Last 3 Encounters:  02/12/24 102/62  10/16/23 115/78  04/10/23 106/69                          Physical Exam- Limited  Constitutional:  Body mass index is 19.76 kg/m. , not in acute distress, normal state of mind Eyes:  EOMI, no exophthalmos Neck: Supple Thyroid : No gross goiter, R>L - suspect she had her left side removed? Cardiovascular: RRR, no murmurs, rubs, or gallops, no edema Respiratory: Adequate breathing efforts, no crackles, rales, rhonchi,  or wheezing Musculoskeletal: no gross deformities, strength intact in all four extremities, no gross restriction of joint movements Skin:  no rashes, no hyperemia Neurological: + tremor with outstretched hands, DTR hyperactive in BLE   CMP     Component Value Date/Time   NA 136 10/16/2023 1156   K 4.4 10/16/2023 1156   CL 99 10/16/2023 1156   CO2 23 10/16/2023 1156   GLUCOSE 123 (H) 10/16/2023 1156   GLUCOSE 93 11/14/2008 1611   BUN 16 10/16/2023 1156   CREATININE 0.78 10/16/2023 1156   CALCIUM  10.0 10/16/2023 1156   PROT 7.5 10/16/2023 1156   ALBUMIN 4.5 10/16/2023 1156   AST 25 10/16/2023 1156   ALT 9 10/16/2023 1156   ALKPHOS 54 10/16/2023 1156   BILITOT 0.3 10/16/2023 1156  EGFR 90 10/16/2023 1156   GFRNONAA 90 02/25/2020 1311     CBC    Component Value Date/Time   WBC 7.2 10/16/2023 1156   WBC 9.4 10/05/2014 0936   WBC 9.3 11/14/2008 1611   RBC 4.38 10/16/2023 1156   RBC 4.9 10/05/2014 0936   RBC 4.49 11/14/2008 1611   HGB 13.6 10/16/2023 1156   HCT 39.7 10/16/2023 1156   PLT 297 10/16/2023 1156   MCV 91 10/16/2023 1156   MCH 31.1 10/16/2023 1156   MCH 28.4 10/05/2014 0936   MCHC 34.3 10/16/2023 1156   MCHC 31.8 10/05/2014 0936   MCHC 35.3 11/14/2008 1611   RDW 12.9 10/16/2023 1156   LYMPHSABS 2.6 10/16/2023 1156   EOSABS 0.3 10/16/2023 1156   BASOSABS 0.1 10/16/2023 1156     Diabetic Labs (most recent): Lab Results  Component Value Date   HGBA1C 5.7 (H) 04/10/2023   HGBA1C 5.4 04/22/2017    Lipid Panel     Component Value Date/Time   CHOL 123 10/16/2023 1156   TRIG 153 (H) 10/16/2023 1156   TRIG 103 10/05/2014 0927   HDL 11 (L) 10/16/2023 1156   HDL 31 (L) 10/05/2014 0927   CHOLHDL 11.2 (H) 10/16/2023 1156   LDLCALC 85 10/16/2023 1156   LDLCALC 48 10/27/2013 1127   LABVLDL 27 10/16/2023 1156     Lab Results  Component Value Date   TSH <0.005 (L) 10/16/2023        Assessment & Plan:   1. H/O partial thyroidectomy  (Primary) 2. Abnormal TSH 3. Thyroid  nodule  she is being seen at a kind request of Lindsay Feller, Lindsay Dickerson.  her history and most recent labs are reviewed, and she was examined clinically. Subjective and objective findings are consistent with thyrotoxicosis likely from primary hyperthyroidism. The potential risks of untreated thyrotoxicosis and the need for definitive therapy have been discussed in detail with her, and she agrees to proceed with diagnostic workup and treatment plan.   I will repeat full profile thyroid  function tests today (including antibody testing to assess for autoimmune thyroid  dysfunction).  Will wait on preliminary labs to result before ordering supportive imaging studies.  Will call patient with results and next steps.   Options of therapy are discussed with her.  We discussed the option of treating it with medications including methimazole or PTU which may have side effects including rash, transaminitis, and bone marrow suppression.  We also discussed the option of definitive therapy with RAI ablation of the thyroid . If she is found to have primary hyperthyroidism from Graves' disease , toxic multinodular goiter or toxic nodular goiter the preferred modality of treatment would be I-131 thyroid  ablation. Surgery is another choice of treatment in some cases, in her case surgery is not a good fit for presentation with only mild goiter. We also talked about percutaneous thyroid  nodule ablation for toxic nodule as a possible treatment.  -Patient is made aware of the high likelihood of post ablative hypothyroidism with subsequent need for lifelong thyroid  hormone replacement. sheunderstands this outcome and she is  willing to proceed.          -Patient is advised to maintain close follow up with Lindsay Feller, Lindsay Dickerson for primary care needs.   - Time spent with the patient: 60 minutes, which was spent in obtaining information about her symptoms, reviewing her previous  labs, evaluations, and treatments, counseling her about her hyperthyroidism , and developing a plan to confirm the diagnosis and long term treatment  as necessary. Please refer to "Patient Self Inventory" in the Media tab for reviewed elements of pertinent patient history.  Lindsay Dickerson participated in the discussions, expressed understanding, and voiced agreement with the above plans.  All questions were answered to her satisfaction. she is encouraged to contact clinic should she have any questions or concerns prior to her return visit.   Follow up plan: Return will call with thyroid  lab results and next steps.   Thank you for involving me in the care of this pleasant patient, and I will continue to update you with her progress.    Hulon Magic, [facility] [address] [place], [place] [address] Phone: [phone] Fax: [phone]  02/12/2024, 3:50 PM

## 2024-02-13 LAB — T3, FREE: T3, Free: 4.9 pg/mL — ABNORMAL HIGH (ref 2.0–4.4)

## 2024-02-13 LAB — THYROGLOBULIN ANTIBODY: Thyroglobulin Antibody: <1 [IU]/mL (ref 0.0–0.9)

## 2024-02-13 LAB — THYROID PEROXIDASE ANTIBODY: Thyroperoxidase Ab SerPl-aCnc: 67 [IU]/mL — ABNORMAL HIGH (ref 0–34)

## 2024-02-13 LAB — TSH: TSH: <0.005 u[IU]/mL — ABNORMAL LOW (ref 0.450–4.500)

## 2024-02-13 LAB — T4, FREE: Free T4: 1.57 ng/dL (ref 0.82–1.77)

## 2024-02-17 ENCOUNTER — Ambulatory Visit: Payer: Self-pay | Admitting: Nurse Practitioner

## 2024-02-17 DIAGNOSIS — R7989 Other specified abnormal findings of blood chemistry: Secondary | ICD-10-CM

## 2024-02-17 DIAGNOSIS — E041 Nontoxic single thyroid nodule: Secondary | ICD-10-CM

## 2024-02-17 DIAGNOSIS — E0590 Thyrotoxicosis, unspecified without thyrotoxic crisis or storm: Secondary | ICD-10-CM

## 2024-02-17 DIAGNOSIS — E890 Postprocedural hypothyroidism: Secondary | ICD-10-CM

## 2024-02-18 NOTE — Telephone Encounter (Signed)
-----   Message from Wendel Hals sent at 02/17/2024  7:36 AM EDT ----- Arlie Lain I sent mychart message going over recent thyroid  labs.

## 2024-02-18 NOTE — Addendum Note (Signed)
 Addended by: Lillyth Spong J on: 02/18/2024 03:28 PM   Modules accepted: Orders

## 2024-02-18 NOTE — Telephone Encounter (Signed)
 Noted, Laurina Popper has sent the patient a message reviewing her results.

## 2024-03-04 ENCOUNTER — Encounter (HOSPITAL_COMMUNITY)
Admission: RE | Admit: 2024-03-04 | Discharge: 2024-03-04 | Disposition: A | Discharge status: Home / Self Care | Source: Ambulatory Visit | Attending: Nurse Practitioner | Admitting: Nurse Practitioner

## 2024-03-04 DIAGNOSIS — E890 Postprocedural hypothyroidism: Secondary | ICD-10-CM | POA: Insufficient documentation

## 2024-03-04 DIAGNOSIS — E0590 Thyrotoxicosis, unspecified without thyrotoxic crisis or storm: Secondary | ICD-10-CM | POA: Insufficient documentation

## 2024-03-04 DIAGNOSIS — Z419 Encounter for procedure for purposes other than remedying health state, unspecified: Secondary | ICD-10-CM | POA: Diagnosis not present

## 2024-03-04 DIAGNOSIS — R7989 Other specified abnormal findings of blood chemistry: Secondary | ICD-10-CM | POA: Insufficient documentation

## 2024-03-04 DIAGNOSIS — E041 Nontoxic single thyroid nodule: Secondary | ICD-10-CM | POA: Diagnosis not present

## 2024-03-04 MED ORDER — SODIUM IODIDE I-123 7.4 MBQ CAPS
460.0000 | ORAL_CAPSULE | Freq: Once | ORAL | Status: AC
  Administered 2024-03-04: 460 via ORAL

## 2024-03-05 ENCOUNTER — Encounter (HOSPITAL_COMMUNITY)
Admission: RE | Admit: 2024-03-05 | Discharge: 2024-03-05 | Disposition: A | Discharge status: Home / Self Care | Source: Ambulatory Visit | Attending: Nurse Practitioner

## 2024-03-05 DIAGNOSIS — E0590 Thyrotoxicosis, unspecified without thyrotoxic crisis or storm: Secondary | ICD-10-CM | POA: Diagnosis not present

## 2024-03-08 NOTE — Progress Notes (Signed)
 Noted, Lindsay Dickerson has sent the patient a message in MyChart going over results.

## 2024-03-08 NOTE — Telephone Encounter (Signed)
-----   Message from Lindsay Dickerson sent at 03/08/2024  7:10 AM EDT ----- Arlie Lain: I sent mychart message going over uptake and scan results ----- Message ----- From: Interface, Rad Results In Sent: 03/05/2024   3:08 PM EDT To: Lindsay Hals, NP

## 2024-03-08 NOTE — Telephone Encounter (Signed)
 Noted, Lindsay Dickerson has sent a message to the patient going over results.

## 2024-03-08 NOTE — Progress Notes (Signed)
 FYI I sent mychart message going over uptake and scan results.

## 2024-03-16 NOTE — Addendum Note (Signed)
 Addended by: Bladen Umar J on: 03/16/2024 09:50 AM   Modules accepted: Orders

## 2024-03-25 NOTE — Written Directive (Addendum)
 MOLECULAR IMAGING AND THERAPEUTICS WRITTEN DIRECTIVE   PATIENT NAME: Lindsay Dickerson  PT DOB:   08/22/1971                                              MRN: [id number]  ---------------------------------------------------------------------------------------------------------------------   I-131 WHOLE THYROID  THERAPY (NON-CANCER)    RADIOPHARMACEUTICAL:   Iodine-131 Capsule    PRESCRIBED DOSE FOR ADMINISTRATION: 10 mCi   ROUTE OFADMINISTRATION: PO   DIAGNOSIS:  Hyperthyroidism   REFERRING PHYSICIAN:  Benton Rio, Dr. Lenis   TSH:    Lab Results  Component Value Date   TSH <0.005 (L) 02/12/2024   TSH <0.005 (L) 10/16/2023     PRIOR I-131 THERAPY (Date and Dose):   PRIOR RADIOLOGY EXAMS (Results and Date): NM THYROID  MULT UPTAKE W/IMAGING Result Date: 03/05/2024 CLINICAL DATA:  Hyperthyroidism. Increased thirst. Weakness. Heart palpitations. TSH equal 0.005. History of prior LEFT hemithyroidectomy. EXAM: THYROID  SCAN AND UPTAKE - 4 AND 24 HOURS TECHNIQUE: Following oral administration of I-123 capsule, anterior planar imaging was acquired at 24 hours. Thyroid  uptake was calculated with a thyroid  probe at 4-6 hours and 24 hours. RADIOPHARMACEUTICALS:  Four hundred sixty uCi I-123 sodium iodide p.o. COMPARISON:  None Available. FINDINGS: Uniform uptake within the remaining RIGHT lobe of thyroid  gland. The pyramidal lobe is evident. 4 hour I-123 uptake = 18.1% (normal 5-20%) 24 hour I-123 uptake = 41% (normal 10-30%) IMPRESSION: Findings suggest Graves disease in the remaining RIGHT lobe of the thyroid  gland. Electronically Signed   By: Jackquline Boxer M.D.   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  Prior Left hemithyroidectomy. Depressed TSH. AUTHORIZED USER SIGNATURE & TIME STAMP: Norleen GORMAN Boxer, MD   03/29/24    1:44 PM

## 2024-03-30 ENCOUNTER — Ambulatory Visit (HOSPITAL_COMMUNITY)

## 2024-03-31 ENCOUNTER — Encounter (HOSPITAL_COMMUNITY)
Admission: RE | Admit: 2024-03-31 | Discharge: 2024-03-31 | Disposition: A | Discharge status: Home / Self Care | Source: Ambulatory Visit | Attending: Nurse Practitioner | Admitting: Nurse Practitioner

## 2024-04-01 ENCOUNTER — Encounter (HOSPITAL_COMMUNITY): Payer: Self-pay

## 2024-04-01 ENCOUNTER — Encounter (HOSPITAL_COMMUNITY)
Admission: RE | Admit: 2024-04-01 | Discharge: 2024-04-01 | Disposition: A | Discharge status: Home / Self Care | Source: Ambulatory Visit | Attending: Nurse Practitioner | Admitting: Nurse Practitioner

## 2024-04-01 DIAGNOSIS — E0500 Thyrotoxicosis with diffuse goiter without thyrotoxic crisis or storm: Secondary | ICD-10-CM | POA: Diagnosis not present

## 2024-04-01 DIAGNOSIS — E890 Postprocedural hypothyroidism: Secondary | ICD-10-CM | POA: Diagnosis not present

## 2024-04-01 DIAGNOSIS — E0590 Thyrotoxicosis, unspecified without thyrotoxic crisis or storm: Secondary | ICD-10-CM | POA: Insufficient documentation

## 2024-04-01 MED ORDER — SODIUM IODIDE I 131 CAPSULE
10.0000 | Freq: Once | INTRAVENOUS | Status: AC | PRN
  Administered 2024-04-01: 9.95 via ORAL

## 2024-04-03 DIAGNOSIS — Z419 Encounter for procedure for purposes other than remedying health state, unspecified: Secondary | ICD-10-CM | POA: Diagnosis not present

## 2024-04-19 ENCOUNTER — Ambulatory Visit: Payer: Medicaid Other | Admitting: Nurse Practitioner

## 2024-04-19 ENCOUNTER — Encounter: Payer: Self-pay | Admitting: Nurse Practitioner

## 2024-04-19 NOTE — Progress Notes (Deleted)
 Subjective:    Patient ID: Lindsay Dickerson, female    DOB: September 05, 1971, 53 y.o.   MRN: [id number]   Chief Complaint: medical management of chronic issues       HPI:  Lindsay Dickerson is a 53 y.o. who identifies as a female who was assigned female at birth.   Social history: Lives with: boyfriend Work history: works at [employer] in today for follow up of the following chronic medical issues:  1. Primary hypertension No c/o chest pain, sob or headache. Does not check blood pressure at home. BP Readings from Last 3 Encounters:  02/12/24 102/62  10/16/23 115/78  04/10/23 106/69     2. Hyperlipidemia with target LDL less than 100 Does not really watch diet. Does no exercise. Lab Results  Component Value Date   CHOL 123 10/16/2023   HDL 11 (L) 10/16/2023   LDLCALC 85 10/16/2023   TRIG 153 (H) 10/16/2023   CHOLHDL 11.2 (H) 10/16/2023     3. Bipolar 1 disorder (HCC) 4. Severe episode of recurrent major depressive disorder, without psychotic features (HCC) Is on effexor , seroquel  and xanax  combination. Says she is doing well.    10/16/2023   11:17 AM 04/10/2023   10:59 AM 09/30/2022    8:37 AM  Depression screen PHQ 2/9  Decreased Interest 1 2 2   Down, Depressed, Hopeless 1 2 2   PHQ - 2 Score 2 4 4   Altered sleeping 1 1 1   Tired, decreased energy 1 1 1   Change in appetite 1 1 1   Feeling bad or failure about yourself  1 0 0  Trouble concentrating 1 1 1   Moving slowly or fidgety/restless 0 0 0  Suicidal thoughts 0 0 0  PHQ-9 Score 7 8 8   Difficult doing work/chores Not difficult at all Not difficult at all Somewhat difficult      10/16/2023   11:17 AM 04/10/2023   10:59 AM 09/30/2022    8:37 AM 12/27/2021   11:24 AM  GAD 7 : Generalized Anxiety Score  Nervous, Anxious, on Edge 1 2 2 1   Control/stop worrying 1 2 2 2   Worry too much - different things 1 2 2 2   Trouble relaxing 1 2 2  0  Restless 1 0 0 1  Easily annoyed or irritable 1 1 1 2    Afraid - awful might happen 1 0 0 0  Total GAD 7 Score 7 9 9 8   Anxiety Difficulty Somewhat difficult Somewhat difficult Somewhat difficult Somewhat difficult      New complaints: None today  Allergies  Allergen Reactions   Erythromycin Anaphylaxis   Zithromax [Azithromycin] Anaphylaxis and Swelling    Throat started closing up   Levaquin [Levofloxacin In D5w]    Outpatient Encounter Medications as of 04/19/2024  Medication Sig   ALPRAZolam  (XANAX ) 0.25 MG tablet Take 1 tablet (0.25 mg total) by mouth 2 (two) times daily as needed for anxiety.   atorvastatin  (LIPITOR) 40 MG tablet Take 1 tablet (40 mg total) by mouth daily at 6 PM. (NEEDS TO BE SEEN BEFORE NEXT REFILL)   fenofibrate  (TRICOR ) 145 MG tablet Take 1 tablet (145 mg total) by mouth daily. (NEEDS TO BE SEEN BEFORE NEXT REFILL)   lisinopril -hydrochlorothiazide  (ZESTORETIC ) 20-12.5 MG tablet Take 1 tablet by mouth daily. (NEEDS TO BE SEEN BEFORE NEXT REFILL)   QUEtiapine  (SEROQUEL ) 200 MG tablet Take 1 tablet (200 mg total) by mouth at bedtime. (NEEDS TO BE SEEN BEFORE NEXT REFILL)  venlafaxine  XR (EFFEXOR -XR) 75 MG 24 hr capsule Take 1 capsule (75 mg total) by mouth daily with breakfast. (NEEDS TO BE SEEN BEFORE NEXT REFILL)   No facility-administered encounter medications on file as of 04/19/2024.    Past Surgical History:  Procedure Laterality Date   ABDOMINAL HYSTERECTOMY     partial, rt bso   APPENDECTOMY     BREAST SURGERY     EYE SURGERY     NASAL SEPTUM SURGERY     THYROIDECTOMY, PARTIAL      Family History  Problem Relation Age of Onset   Hypertension Mother    Hyperlipidemia Father    Hypertension Father       Controlled substance contract: n/a     Review of Systems  Constitutional:  Negative for diaphoresis.  Eyes:  Negative for pain.  Respiratory:  Negative for shortness of breath.   Cardiovascular:  Negative for chest pain, palpitations and leg swelling.  Gastrointestinal:  Negative for  abdominal pain.  Endocrine: Negative for polydipsia.  Skin:  Negative for rash.  Neurological:  Negative for dizziness, weakness and headaches.  Hematological:  Does not bruise/bleed easily.  All other systems reviewed and are negative.      Objective:   Physical Exam Vitals and nursing note reviewed.  Constitutional:      General: She is not in acute distress.    Appearance: Normal appearance. She is well-developed.  HENT:     Head: Normocephalic.     Right Ear: Tympanic membrane normal.     Left Ear: Tympanic membrane normal.     Nose: Nose normal.     Mouth/Throat:     Mouth: Mucous membranes are moist.  Eyes:     Pupils: Pupils are equal, round, and reactive to light.  Neck:     Vascular: No carotid bruit or JVD.  Cardiovascular:     Rate and Rhythm: Normal rate and regular rhythm.     Heart sounds: Normal heart sounds.  Pulmonary:     Effort: Pulmonary effort is normal. No respiratory distress.     Breath sounds: Normal breath sounds. No wheezing or rales.  Chest:     Chest wall: No tenderness.  Abdominal:     General: Bowel sounds are normal. There is no distension or abdominal bruit.     Palpations: Abdomen is soft. There is no hepatomegaly, splenomegaly, mass or pulsatile mass.     Tenderness: There is no abdominal tenderness.  Genitourinary:    General: Normal vulva.     Vagina: No vaginal discharge.     Rectum: Normal.     Comments: Vaginal cuff intact No adnexal masses or tenderness Musculoskeletal:        General: Normal range of motion.     Cervical back: Normal range of motion and neck supple.  Lymphadenopathy:     Cervical: No cervical adenopathy.  Skin:    General: Skin is warm and dry.  Neurological:     Mental Status: She is alert and oriented to person, place, and time.     Deep Tendon Reflexes: Reflexes are normal and symmetric.  Psychiatric:        Behavior: Behavior normal.        Thought Content: Thought content normal.        Judgment:  Judgment normal.    There were no vitals taken for this visit.        Assessment & Plan:   Lindsay Dickerson comes in today with chief  complaint of No chief complaint on file.   Diagnosis and orders addressed:  1. Annual physical   2. Primary hypertension Low sodium diet - lisinopril -hydrochlorothiazide  (ZESTORETIC ) 20-12.5 MG tablet; Take 1 tablet by mouth daily. (NEEDS TO BE SEEN BEFORE NEXT REFILL)  Dispense: 90 tablet; Refill: 1 - CBC with Differential/Platelet - CMP14+EGFR  3. Hyperlipidemia with target LDL less than 100 Low fat diet - atorvastatin  (LIPITOR) 40 MG tablet; Take 1 tablet (40 mg total) by mouth daily at 6 PM. (NEEDS TO BE SEEN BEFORE NEXT REFILL)  Dispense: 90 tablet; Refill: 1 - fenofibrate  (TRICOR ) 145 MG tablet; Take 1 tablet (145 mg total) by mouth daily. (NEEDS TO BE SEEN BEFORE NEXT REFILL)  Dispense: 90 tablet; Refill: 1 - Lipid panel  4. Bipolar 1 disorder (HCC) - ALPRAZolam  (XANAX ) 0.25 MG tablet; Take 1 tablet (0.25 mg total) by mouth 2 (two) times daily as needed for anxiety.  Dispense: 60 tablet; Refill: 5 - QUEtiapine  (SEROQUEL ) 200 MG tablet; Take 1 tablet (200 mg total) by mouth at bedtime. (NEEDS TO BE SEEN BEFORE NEXT REFILL)  Dispense: 90 tablet; Refill: 1  5. Severe episode of recurrent major depressive disorder, without psychotic features (HCC) Stress management - venlafaxine  XR (EFFEXOR -XR) 75 MG 24 hr capsule; Take 1 capsule (75 mg total) by mouth daily with breakfast. (NEEDS TO BE SEEN BEFORE NEXT REFILL)  Dispense: 90 capsule; Refill: 1  6. Encounter for mammogram to establish baseline mammogram - MM 3D DIAGNOSTIC MAMMOGRAM BILATERAL BREAST W/IMPLANT   Labs pending Health Maintenance reviewed Diet and exercise encouraged  Follow up plan: 6 months   Lindsay Gladis, FNP

## 2024-04-22 NOTE — Progress Notes (Unsigned)
 Subjective:    Patient ID: Lindsay Dickerson, female    DOB: November 07, 1970, 53 y.o.   MRN: [id number]   Chief Complaint: medical management of chronic issues       HPI:  Lindsay Dickerson is a 53 y.o. who identifies as a female who was assigned female at birth.   Social history: Lives with: boyfriend Work history: works at [employer] in today for follow up of the following chronic medical issues:  1. Primary hypertension No c/o chest pain, sob or headache. Does not check blood pressure at home. BP Readings from Last 3 Encounters:  02/12/24 102/62  10/16/23 115/78  04/10/23 106/69     2. Hyperlipidemia with target LDL less than 100 Does not really watch diet. Does no exercise. Lab Results  Component Value Date   CHOL 123 10/16/2023   HDL 11 (L) 10/16/2023   LDLCALC 85 10/16/2023   TRIG 153 (H) 10/16/2023   CHOLHDL 11.2 (H) 10/16/2023     3. Bipolar 1 disorder (HCC) 4. Severe episode of recurrent major depressive disorder, without psychotic features (HCC) Is on effexor , seroquel  and xanax  combination. Says she is doing well.  ***    New complaints: None today  Allergies  Allergen Reactions   Erythromycin Anaphylaxis   Zithromax [Azithromycin] Anaphylaxis and Swelling    Throat started closing up   Levaquin [Levofloxacin In D5w]    Outpatient Encounter Medications as of 04/23/2024  Medication Sig   ALPRAZolam  (XANAX ) 0.25 MG tablet Take 1 tablet (0.25 mg total) by mouth 2 (two) times daily as needed for anxiety.   atorvastatin  (LIPITOR) 40 MG tablet Take 1 tablet (40 mg total) by mouth daily at 6 PM. (NEEDS TO BE SEEN BEFORE NEXT REFILL)   fenofibrate  (TRICOR ) 145 MG tablet Take 1 tablet (145 mg total) by mouth daily. (NEEDS TO BE SEEN BEFORE NEXT REFILL)   lisinopril -hydrochlorothiazide  (ZESTORETIC ) 20-12.5 MG tablet Take 1 tablet by mouth daily. (NEEDS TO BE SEEN BEFORE NEXT REFILL)   QUEtiapine  (SEROQUEL ) 200 MG tablet Take 1 tablet (200  mg total) by mouth at bedtime. (NEEDS TO BE SEEN BEFORE NEXT REFILL)   venlafaxine  XR (EFFEXOR -XR) 75 MG 24 hr capsule Take 1 capsule (75 mg total) by mouth daily with breakfast. (NEEDS TO BE SEEN BEFORE NEXT REFILL)   No facility-administered encounter medications on file as of 04/23/2024.    Past Surgical History:  Procedure Laterality Date   ABDOMINAL HYSTERECTOMY     partial, rt bso   APPENDECTOMY     BREAST SURGERY     EYE SURGERY     NASAL SEPTUM SURGERY     THYROIDECTOMY, PARTIAL      Family History  Problem Relation Age of Onset   Hypertension Mother    Hyperlipidemia Father    Hypertension Father       Controlled substance contract: n/a     Review of Systems  Constitutional:  Negative for diaphoresis.  Eyes:  Negative for pain.  Respiratory:  Negative for shortness of breath.   Cardiovascular:  Negative for chest pain, palpitations and leg swelling.  Gastrointestinal:  Negative for abdominal pain.  Endocrine: Negative for polydipsia.  Skin:  Negative for rash.  Neurological:  Negative for dizziness, weakness and headaches.  Hematological:  Does not bruise/bleed easily.  All other systems reviewed and are negative.      Objective:   Physical Exam Vitals and nursing note reviewed.  Constitutional:      General:  She is not in acute distress.    Appearance: Normal appearance. She is well-developed.  HENT:     Head: Normocephalic.     Right Ear: Tympanic membrane normal.     Left Ear: Tympanic membrane normal.     Nose: Nose normal.     Mouth/Throat:     Mouth: Mucous membranes are moist.  Eyes:     Pupils: Pupils are equal, round, and reactive to light.  Neck:     Vascular: No carotid bruit or JVD.  Cardiovascular:     Rate and Rhythm: Normal rate and regular rhythm.     Heart sounds: Normal heart sounds.  Pulmonary:     Effort: Pulmonary effort is normal. No respiratory distress.     Breath sounds: Normal breath sounds. No wheezing or rales.   Chest:     Chest wall: No tenderness.  Abdominal:     General: Bowel sounds are normal. There is no distension or abdominal bruit.     Palpations: Abdomen is soft. There is no hepatomegaly, splenomegaly, mass or pulsatile mass.     Tenderness: There is no abdominal tenderness.  Genitourinary:    General: Normal vulva.     Vagina: No vaginal discharge.     Rectum: Normal.     Comments: Vaginal cuff intact No adnexal masses or tenderness Musculoskeletal:        General: Normal range of motion.     Cervical back: Normal range of motion and neck supple.  Lymphadenopathy:     Cervical: No cervical adenopathy.  Skin:    General: Skin is warm and dry.  Neurological:     Mental Status: She is alert and oriented to person, place, and time.     Deep Tendon Reflexes: Reflexes are normal and symmetric.  Psychiatric:        Behavior: Behavior normal.        Thought Content: Thought content normal.        Judgment: Judgment normal.    There were no vitals taken for this visit.        Assessment & Plan:   Lindsay Dickerson comes in today with chief complaint of No chief complaint on file.   Diagnosis and orders addressed:  1. Annual physical   2. Primary hypertension Low sodium diet - lisinopril -hydrochlorothiazide  (ZESTORETIC ) 20-12.5 MG tablet; Take 1 tablet by mouth daily. (NEEDS TO BE SEEN BEFORE NEXT REFILL)  Dispense: 90 tablet; Refill: 1 - CBC with Differential/Platelet - CMP14+EGFR  3. Hyperlipidemia with target LDL less than 100 Low fat diet - atorvastatin  (LIPITOR) 40 MG tablet; Take 1 tablet (40 mg total) by mouth daily at 6 PM. (NEEDS TO BE SEEN BEFORE NEXT REFILL)  Dispense: 90 tablet; Refill: 1 - fenofibrate  (TRICOR ) 145 MG tablet; Take 1 tablet (145 mg total) by mouth daily. (NEEDS TO BE SEEN BEFORE NEXT REFILL)  Dispense: 90 tablet; Refill: 1 - Lipid panel  4. Bipolar 1 disorder (HCC) - ALPRAZolam  (XANAX ) 0.25 MG tablet; Take 1 tablet (0.25 mg total) by  mouth 2 (two) times daily as needed for anxiety.  Dispense: 60 tablet; Refill: 5 - QUEtiapine  (SEROQUEL ) 200 MG tablet; Take 1 tablet (200 mg total) by mouth at bedtime. (NEEDS TO BE SEEN BEFORE NEXT REFILL)  Dispense: 90 tablet; Refill: 1  5. Severe episode of recurrent major depressive disorder, without psychotic features (HCC) Stress management - venlafaxine  XR (EFFEXOR -XR) 75 MG 24 hr capsule; Take 1 capsule (75 mg total) by mouth daily with  breakfast. (NEEDS TO BE SEEN BEFORE NEXT REFILL)  Dispense: 90 capsule; Refill: 1  6. Encounter for mammogram to establish baseline mammogram - MM 3D DIAGNOSTIC MAMMOGRAM BILATERAL BREAST W/IMPLANT   Labs pending Health Maintenance reviewed Diet and exercise encouraged  Follow up plan: 6 months   Mary-Margaret Gladis, FNP

## 2024-04-23 ENCOUNTER — Encounter: Payer: Self-pay | Admitting: Nurse Practitioner

## 2024-04-23 ENCOUNTER — Telehealth (INDEPENDENT_AMBULATORY_CARE_PROVIDER_SITE_OTHER): Admitting: Nurse Practitioner

## 2024-04-23 DIAGNOSIS — F319 Bipolar disorder, unspecified: Secondary | ICD-10-CM

## 2024-04-23 DIAGNOSIS — E785 Hyperlipidemia, unspecified: Secondary | ICD-10-CM | POA: Diagnosis not present

## 2024-04-23 DIAGNOSIS — F332 Major depressive disorder, recurrent severe without psychotic features: Secondary | ICD-10-CM

## 2024-04-23 DIAGNOSIS — I10 Essential (primary) hypertension: Secondary | ICD-10-CM | POA: Diagnosis not present

## 2024-04-23 NOTE — Progress Notes (Signed)
 Virtual Visit Consent   Lindsay Dickerson, you are scheduled for a virtual visit with Lindsay Gladis, FNP, a [facility] provider, today.     Just as with appointments in the office, your consent must be obtained to participate.  Your consent will be active for this visit and any virtual visit you may have with one of our providers in the next 365 days.     If you have a MyChart account, a copy of this consent can be sent to you electronically.  All virtual visits are billed to your insurance company just like a traditional visit in the office.    As this is a virtual visit, video technology does not allow for your provider to perform a traditional examination.  This may limit your provider's ability to fully assess your condition.  If your provider identifies any concerns that need to be evaluated in person or the need to arrange testing (such as labs, EKG, etc.), we will make arrangements to do so.     Although advances in technology are sophisticated, we cannot ensure that it will always work on either your end or our end.  If the connection with a video visit is poor, the visit may have to be switched to a telephone visit.  With either a video or telephone visit, we are not always able to ensure that we have a secure connection.     I need to obtain your verbal consent now.   Are you willing to proceed with your visit today? YES   Lindsay Dickerson has provided verbal consent on 04/23/2024 for a virtual visit (video or telephone).   Lindsay Gladis, FNP   Date: 04/23/2024 11:04 AM   Virtual Visit via Video Note   I, Lindsay Dickerson, connected with Lindsay Dickerson ([id number], [birth year]) on 04/23/24 at 11:00 AM EDT by a video-enabled telemedicine application and verified that I am speaking with the correct person using two identifiers.  Location: Patient: Virtual Visit Location Patient: Home Provider: Virtual Visit Location Provider: Mobile   I discussed the  limitations of evaluation and management by telemedicine and the availability of in person appointments. The patient expressed understanding and agreed to proceed.    History of Present Illness: Lindsay Dickerson is a 53 y.o. who identifies as a female who was assigned female at birth, and is being seen today for chronic follow up.  HPI:   Lab Results  Component Value Date   CHOL 123 10/16/2023   HDL 11 (L) 10/16/2023   LDLCALC 85 10/16/2023   TRIG 153 (H) 10/16/2023   CHOLHDL 11.2 (H) 10/16/2023   ROS  Problems:  Patient Active Problem List   Diagnosis Date Noted   Bipolar 1 disorder (HCC) 07/25/2017   Bacterial vaginosis 07/25/2017   Psoriasis 05/14/2016   Depression 10/05/2014   Hypertension 10/27/2013   Hyperlipidemia with target LDL less than 100 10/27/2013    Allergies:  Allergies  Allergen Reactions   Erythromycin Anaphylaxis   Zithromax [Azithromycin] Anaphylaxis and Swelling    Throat started closing up   Levaquin [Levofloxacin In D5w]    Medications:  Current Outpatient Medications:    ALPRAZolam  (XANAX ) 0.25 MG tablet, Take 1 tablet (0.25 mg total) by mouth 2 (two) times daily as needed for anxiety., Disp: 60 tablet, Rfl: 5   atorvastatin  (LIPITOR) 40 MG tablet, Take 1 tablet (40 mg total) by mouth daily at 6 PM. (NEEDS TO BE SEEN BEFORE NEXT REFILL), Disp: 90 tablet, Rfl:  1   fenofibrate  (TRICOR ) 145 MG tablet, Take 1 tablet (145 mg total) by mouth daily. (NEEDS TO BE SEEN BEFORE NEXT REFILL), Disp: 90 tablet, Rfl: 1   lisinopril -hydrochlorothiazide  (ZESTORETIC ) 20-12.5 MG tablet, Take 1 tablet by mouth daily. (NEEDS TO BE SEEN BEFORE NEXT REFILL), Disp: 90 tablet, Rfl: 1   QUEtiapine  (SEROQUEL ) 200 MG tablet, Take 1 tablet (200 mg total) by mouth at bedtime. (NEEDS TO BE SEEN BEFORE NEXT REFILL), Disp: 90 tablet, Rfl: 1   venlafaxine  XR (EFFEXOR -XR) 75 MG 24 hr capsule, Take 1 capsule (75 mg total) by mouth daily with breakfast. (NEEDS TO BE SEEN BEFORE NEXT  REFILL), Disp: 90 capsule, Rfl: 1  Observations/Objective: Patient is well-developed, well-nourished in no acute distress.  Resting comfortably  at home.  Head is normocephalic, atraumatic.  No labored breathing.  Speech is clear and coherent with logical content.  Patient is alert and oriented at baseline.    Assessment and Plan:  Lindsay Dickerson comes in today with chief complaint of No chief complaint on file.   Diagnosis and orders addressed:  1. Primary hypertension (Primary) Low salt diet  2. Hyperlipidemia with target LDL less than 100 Low fat diet - CBC with Differential/Platelet; Future - CMP14+EGFR; Future - Lipid panel; Future  3. Severe episode of recurrent major depressive disorder, without psychotic features (HCC) Stress management  4. Bipolar 1 disorder (HCC) Stress management   - Thyroid  Panel With TSH   Labs pending Health Maintenance reviewed Diet and exercise encouraged  Follow up plan: 6 months   Follow Up Instructions: I discussed the assessment and treatment plan with the patient. The patient was provided an opportunity to ask questions and all were answered. The patient agreed with the plan and demonstrated an understanding of the instructions.  A copy of instructions were sent to the patient via MyChart.  The patient was advised to Dickerson back or seek an in-person evaluation if the symptoms worsen or if the condition fails to improve as anticipated.  Time:  I spent 9 minutes with the patient via telehealth technology discussing the above problems/concerns.    Lindsay Gladis, FNP

## 2024-04-23 NOTE — Patient Instructions (Signed)
 Exercising to Stay Healthy To become healthy and stay healthy, it is recommended that you do moderate-intensity and vigorous-intensity exercise. You can tell that you are exercising at a moderate intensity if your heart starts beating faster and you start breathing faster but can still hold a conversation. You can tell that you are exercising at a vigorous intensity if you are breathing much harder and faster and cannot hold a conversation while exercising. How can exercise benefit me? Exercising regularly is important. It has many health benefits, such as: Improving overall fitness, flexibility, and endurance. Increasing bone density. Helping with weight control. Decreasing body fat. Increasing muscle strength and endurance. Reducing stress and tension, anxiety, depression, or anger. Improving overall health. What guidelines should I follow while exercising? Before you start a new exercise program, talk with your health care provider. Do not exercise so much that you hurt yourself, feel dizzy, or get very short of breath. Wear comfortable clothes and wear shoes with good support. Drink plenty of water while you exercise to prevent dehydration or heat stroke. Work out until your breathing and your heartbeat get faster (moderate intensity). How often should I exercise? Choose an activity that you enjoy, and set realistic goals. Your health care provider can help you make an activity plan that is individually designed and works best for you. Exercise regularly as told by your health care provider. This may include: Doing strength training two times a week, such as: Lifting weights. Using resistance bands. Push-ups. Sit-ups. Yoga. Doing a certain intensity of exercise for a given amount of time. Choose from these options: A total of 150 minutes of moderate-intensity exercise every week. A total of 75 minutes of vigorous-intensity exercise every week. A mix of moderate-intensity and  vigorous-intensity exercise every week. Children, pregnant women, people who have not exercised regularly, people who are overweight, and older adults may need to talk with a health care provider about what activities are safe to perform. If you have a medical condition, be sure to talk with your health care provider before you start a new exercise program. What are some exercise ideas? Moderate-intensity exercise ideas include: Walking 1 mile (1.6 km) in about 15 minutes. Biking. Hiking. Golfing. Dancing. Water aerobics. Vigorous-intensity exercise ideas include: Walking 4.5 miles (7.2 km) or more in about 1 hour. Jogging or running 5 miles (8 km) in about 1 hour. Biking 10 miles (16.1 km) or more in about 1 hour. Lap swimming. Roller-skating or in-line skating. Cross-country skiing. Vigorous competitive sports, such as football, basketball, and soccer. Jumping rope. Aerobic dancing. What are some everyday activities that can help me get exercise? Yard work, such as: Child psychotherapist. Raking and bagging leaves. Washing your car. Pushing a stroller. Shoveling snow. Gardening. Washing windows or floors. How can I be more active in my day-to-day activities? Use stairs instead of an elevator. Take a walk during your lunch break. If you drive, park your car farther away from your work or school. If you take public transportation, get off one stop early and walk the rest of the way. Stand up or walk around during all of your indoor phone calls. Get up, stretch, and walk around every 30 minutes throughout the day. Enjoy exercise with a friend. Support to continue exercising will help you keep a regular routine of activity. Where to find more information You can find more information about exercising to stay healthy from: U.S. Department of Health and Human Services: ThisPath.fi Centers for Disease Control and Prevention (  CDC): FootballExhibition.com.br Summary Exercising regularly is  important. It will improve your overall fitness, flexibility, and endurance. Regular exercise will also improve your overall health. It can help you control your weight, reduce stress, and improve your bone density. Do not exercise so much that you hurt yourself, feel dizzy, or get very short of breath. Before you start a new exercise program, talk with your health care provider. This information is not intended to replace advice given to you by your health care provider. Make sure you discuss any questions you have with your health care provider. Document Revised: 01/05/2021 Document Reviewed: 01/05/2021 Elsevier Patient Education  2024 [employer].

## 2024-04-29 ENCOUNTER — Other Ambulatory Visit

## 2024-04-29 DIAGNOSIS — E785 Hyperlipidemia, unspecified: Secondary | ICD-10-CM

## 2024-04-29 LAB — LIPID PANEL

## 2024-04-30 ENCOUNTER — Ambulatory Visit: Payer: Self-pay | Admitting: Nurse Practitioner

## 2024-04-30 LAB — CBC WITH DIFFERENTIAL/PLATELET
Basophils Absolute: 0.1 x10E3/uL (ref 0.0–0.2)
Basos: 1 %
EOS (ABSOLUTE): 0.5 x10E3/uL — ABNORMAL HIGH (ref 0.0–0.4)
Eos: 6 %
Hematocrit: 37.1 % (ref 34.0–46.6)
Hemoglobin: 11.9 g/dL (ref 11.1–15.9)
Immature Grans (Abs): 0 x10E3/uL (ref 0.0–0.1)
Immature Granulocytes: 0 %
Lymphocytes Absolute: 2.5 x10E3/uL (ref 0.7–3.1)
Lymphs: 27 %
MCH: 30.4 pg (ref 26.6–33.0)
MCHC: 32.1 g/dL (ref 31.5–35.7)
MCV: 95 fL (ref 79–97)
Monocytes Absolute: 0.8 x10E3/uL (ref 0.1–0.9)
Monocytes: 9 %
Neutrophils Absolute: 5.4 x10E3/uL (ref 1.4–7.0)
Neutrophils: 57 %
Platelets: 254 x10E3/uL (ref 150–450)
RBC: 3.91 x10E6/uL (ref 3.77–5.28)
RDW: 13.1 % (ref 11.7–15.4)
WBC: 9.3 x10E3/uL (ref 3.4–10.8)

## 2024-04-30 LAB — CMP14+EGFR
ALT: 8 IU/L (ref 0–32)
AST: 21 IU/L (ref 0–40)
Albumin: 4.2 g/dL (ref 3.8–4.9)
Alkaline Phosphatase: 53 IU/L (ref 44–121)
BUN/Creatinine Ratio: 17 (ref 9–23)
BUN: 14 mg/dL (ref 6–24)
Bilirubin Total: 0.6 mg/dL (ref 0.0–1.2)
CO2: 23 mmol/L (ref 20–29)
Calcium: 9.4 mg/dL (ref 8.7–10.2)
Chloride: 102 mmol/L (ref 96–106)
Creatinine, Ser: 0.82 mg/dL (ref 0.57–1.00)
Globulin, Total: 2.8 g/dL (ref 1.5–4.5)
Glucose: 98 mg/dL (ref 70–99)
Potassium: 4.1 mmol/L (ref 3.5–5.2)
Sodium: 140 mmol/L (ref 134–144)
Total Protein: 7 g/dL (ref 6.0–8.5)
eGFR: 84 mL/min/1.73 (ref >59)

## 2024-04-30 LAB — LIPID PANEL
Cholesterol, Total: 99 mg/dL — AB (ref 100–199)
HDL: 16 mg/dL — AB (ref >39)
LDL CALC COMMENT:: 6.2 ratio — AB (ref 0.0–4.4)
LDL Chol Calc (NIH): 65 mg/dL (ref 0–99)
Triglycerides: 88 mg/dL (ref 0–149)
VLDL Cholesterol Cal: 18 mg/dL (ref 5–40)

## 2024-05-04 ENCOUNTER — Other Ambulatory Visit

## 2024-05-04 DIAGNOSIS — Z419 Encounter for procedure for purposes other than remedying health state, unspecified: Secondary | ICD-10-CM | POA: Diagnosis not present

## 2024-05-10 ENCOUNTER — Telehealth: Payer: Self-pay | Admitting: *Deleted

## 2024-05-10 DIAGNOSIS — E0590 Thyrotoxicosis, unspecified without thyrotoxic crisis or storm: Secondary | ICD-10-CM

## 2024-05-10 NOTE — Telephone Encounter (Signed)
 Patient left a message , she states that she had the radio active pill , and she wants to see if she should go ahead and make appointment to be seen after her 6 week post procedure, so that she may get on the correct medication.

## 2024-05-10 NOTE — Telephone Encounter (Signed)
 Yes, we need to get her labs ordered and get her on the books pretty soon.  She had ablation on July 10th putting this week as her 6 weeks.  We can overbook for it.

## 2024-05-10 NOTE — Telephone Encounter (Signed)
 Tried to call patient. Her voicemail is full and cannot leave a message.

## 2024-05-11 NOTE — Telephone Encounter (Signed)
 I have tried to call the patient again, Her voicemail is not set up, unable to leave her a message. Patient may be at work. May I ask that you try and each her , letting her know to go to a [facility] to get her labs done. Benton says that we may over book for this one.

## 2024-05-21 LAB — TSH: TSH: <0.005 u[IU]/mL — ABNORMAL LOW (ref 0.450–4.500)

## 2024-05-21 LAB — T3, FREE: T3, Free: 6.3 pg/mL — ABNORMAL HIGH (ref 2.0–4.4)

## 2024-05-21 LAB — T4, FREE: Free T4: 2.59 ng/dL — ABNORMAL HIGH (ref 0.82–1.77)

## 2024-05-27 ENCOUNTER — Ambulatory Visit (INDEPENDENT_AMBULATORY_CARE_PROVIDER_SITE_OTHER): Admitting: Nurse Practitioner

## 2024-05-27 ENCOUNTER — Encounter: Payer: Self-pay | Admitting: Nurse Practitioner

## 2024-05-27 VITALS — BP 102/60 | HR 82 | Ht 66.0 in | Wt 120.6 lb

## 2024-05-27 DIAGNOSIS — E0590 Thyrotoxicosis, unspecified without thyrotoxic crisis or storm: Secondary | ICD-10-CM

## 2024-05-27 MED ORDER — PROPRANOLOL HCL 20 MG PO TABS: 20.0000 mg | ORAL_TABLET | Freq: Two times a day (BID) | ORAL | 0 refills | Status: AC

## 2024-05-27 MED ORDER — PROPRANOLOL HCL 20 MG PO TABS: 20.0000 mg | ORAL_TABLET | Freq: Two times a day (BID) | ORAL | 2 refills | Status: DC

## 2024-05-27 NOTE — Progress Notes (Signed)
 05/27/2024     Endocrinology Follow Up Note    Subjective:    Patient ID: Lindsay Dickerson, female    DOB: 25-May-1971, PCP Gladis Mustard, FNP.   Past Medical History:  Diagnosis Date   Depression    Hyperlipidemia    Hypertension     Past Surgical History:  Procedure Laterality Date   ABDOMINAL HYSTERECTOMY     partial, rt bso   APPENDECTOMY     BREAST SURGERY     EYE SURGERY     NASAL SEPTUM SURGERY     THYROIDECTOMY, PARTIAL      Social History   Socioeconomic History   Marital status: Single    Spouse name: Not on file   Number of children: Not on file   Years of education: Not on file   Highest education level: Not on file  Occupational History   Not on file  Tobacco Use   Smoking status: Some Days    Current packs/day: 0.00    Types: Cigarettes    Last attempt to quit: 10/27/2008    Years since quitting: 15.5   Smokeless tobacco: Never  Substance and Sexual Activity   Alcohol use: Yes   Drug use: Yes    Types: Marijuana   Sexual activity: Not on file  Other Topics Concern   Not on file  Social History Narrative   Not on file   Social Drivers of Health   Financial Resource Strain: Not on file  Food Insecurity: Not on file  Transportation Needs: Not on file  Physical Activity: Not on file  Stress: Not on file  Social Connections: Unknown (02/04/2022)   [facility]   Social Network    Social Network: Not on file    Family History  Problem Relation Age of Onset   Hypertension Mother    Hyperlipidemia Father    Hypertension Father     Outpatient Encounter Medications as of 05/27/2024  Medication Sig   ALPRAZolam  (XANAX ) 0.25 MG tablet Take 1 tablet (0.25 mg total) by mouth 2 (two) times daily as needed for anxiety.   atorvastatin  (LIPITOR) 40 MG tablet Take 1 tablet (40 mg total) by mouth daily at 6 PM. (NEEDS TO BE SEEN BEFORE NEXT REFILL)   fenofibrate  (TRICOR ) 145 MG tablet Take 1 tablet (145 mg total) by  mouth daily. (NEEDS TO BE SEEN BEFORE NEXT REFILL)   lisinopril -hydrochlorothiazide  (ZESTORETIC ) 20-12.5 MG tablet Take 1 tablet by mouth daily. (NEEDS TO BE SEEN BEFORE NEXT REFILL)   QUEtiapine  (SEROQUEL ) 200 MG tablet Take 1 tablet (200 mg total) by mouth at bedtime. (NEEDS TO BE SEEN BEFORE NEXT REFILL)   venlafaxine  XR (EFFEXOR -XR) 75 MG 24 hr capsule Take 1 capsule (75 mg total) by mouth daily with breakfast. (NEEDS TO BE SEEN BEFORE NEXT REFILL)   [DISCONTINUED] propranolol  (INDERAL ) 20 MG tablet Take 1 tablet (20 mg total) by mouth 2 (two) times daily.   propranolol  (INDERAL ) 20 MG tablet Take 1 tablet (20 mg total) by mouth 2 (two) times daily.   No facility-administered encounter medications on file as of 05/27/2024.    ALLERGIES: Allergies  Allergen Reactions   Erythromycin Anaphylaxis   Zithromax [Azithromycin] Anaphylaxis and Swelling    Throat started closing up   Levaquin [Levofloxacin In D5w]     VACCINATION STATUS: Immunization History  Administered Date(s) Administered   Influenza,inj,Quad PF,6+ Mos 10/05/2014, 07/25/2017   Moderna Sars-Covid-2 Vaccination 12/28/2019, 01/25/2020   Tdap 10/05/2014  HPI  Lindsay Dickerson is 53 y.o. female who presents today with a medical history as above. she is being seen in consultation for hyperthyroidism requested by Gladis Mustard, FNP.  she has been dealing with symptoms of hot flashes, unexplained weight loss (attributes this to recent break up), palpitations and tremors for about 2-3 months. These symptoms are progressively worsening and troubling to her.  her most recent thyroid  labs revealed suppressed TSH of < 0.005 on 10/16/23.  This was found during routine annual physical.  she denies dysphagia, choking, shortness of breath, no recent voice change.    she does have extensive family history of thyroid  dysfunction in her dad and sister (both hyperactive requiring RAI ablation), but denies family hx of thyroid   cancer. she denies personal history of goiter. she is not on any anti-thyroid  medications nor on any thyroid  hormone supplements. Denies use of Biotin containing supplements.  she is willing to proceed with appropriate work up and therapy for thyrotoxicosis.  She was previously seen by Dr. Balan for thyroid  problems thyrotoxicosis and nodules dating back to 2010.  Ultimately she did have partial thyroidectomy as well as biopsy favoring benignity.  Records from his office are not available to review.  She never needed any thyroid  hormone replacement after her surgery.  -She is s/p RAI ablation on 7/10.  Review of systems  Constitutional: + decreasing body weight, current Body mass index is 19.47 kg/m., no fatigue, + subjective hyperthermia, no subjective hypothermia Eyes: no blurry vision, no xerophthalmia ENT: no sore throat, no nodules palpated in throat, no dysphagia/odynophagia, no hoarseness Cardiovascular: no chest pain, no shortness of breath, + palpitations, no leg swelling Respiratory: no cough, no shortness of breath Gastrointestinal: no nausea/vomiting/diarrhea Musculoskeletal: no muscle/joint aches Skin: no rashes, no hyperemia Neurological: + tremors, no numbness, no tingling, no dizziness Psychiatric: no depression, + anxiety- controlled on meds   Objective:     BP 102/60   Pulse 82   Ht 5' 6 (1.676 m)   Wt 120 lb 9.6 oz (54.7 kg)   BMI 19.47 kg/m   Wt Readings from Last 3 Encounters:  05/27/24 120 lb 9.6 oz (54.7 kg)  02/12/24 122 lb 6.4 oz (55.5 kg)  10/16/23 124 lb (56.2 kg)     BP Readings from Last 3 Encounters:  05/27/24 102/60  02/12/24 102/62  10/16/23 115/78                          Physical Exam- Limited  Constitutional:  Body mass index is 19.47 kg/m. , not in acute distress, normal state of mind Eyes:  EOMI, no exophthalmos Skin:  no rashes, no hyperemia Neurological: + tremor with outstretched hands, DTR hyperactive in BLE   CMP      Component Value Date/Time   NA 140 04/29/2024 1334   K 4.1 04/29/2024 1334   CL 102 04/29/2024 1334   CO2 23 04/29/2024 1334   GLUCOSE 98 04/29/2024 1334   GLUCOSE 93 11/14/2008 1611   BUN 14 04/29/2024 1334   CREATININE 0.82 04/29/2024 1334   CALCIUM  9.4 04/29/2024 1334   PROT 7.0 04/29/2024 1334   ALBUMIN 4.2 04/29/2024 1334   AST 21 04/29/2024 1334   ALT 8 04/29/2024 1334   ALKPHOS 53 04/29/2024 1334   BILITOT 0.6 04/29/2024 1334   EGFR 84 04/29/2024 1334   GFRNONAA 90 02/25/2020 1311     CBC    Component Value Date/Time   WBC 9.3  04/29/2024 1334   WBC 9.4 10/05/2014 0936   WBC 9.3 11/14/2008 1611   RBC 3.91 04/29/2024 1334   RBC 4.9 10/05/2014 0936   RBC 4.49 11/14/2008 1611   HGB 11.9 04/29/2024 1334   HCT 37.1 04/29/2024 1334   PLT 254 04/29/2024 1334   MCV 95 04/29/2024 1334   MCH 30.4 04/29/2024 1334   MCH 28.4 10/05/2014 0936   MCHC 32.1 04/29/2024 1334   MCHC 31.8 10/05/2014 0936   MCHC 35.3 11/14/2008 1611   RDW 13.1 04/29/2024 1334   LYMPHSABS 2.5 04/29/2024 1334   EOSABS 0.5 (H) 04/29/2024 1334   BASOSABS 0.1 04/29/2024 1334     Diabetic Labs (most recent): Lab Results  Component Value Date   HGBA1C 5.7 (H) 04/10/2023   HGBA1C 5.4 04/22/2017    Lipid Panel     Component Value Date/Time   CHOL 99 (L) 04/29/2024 1334   TRIG 88 04/29/2024 1334   TRIG 103 10/05/2014 0927   HDL 16 (L) 04/29/2024 1334   HDL 31 (L) 10/05/2014 0927   CHOLHDL 6.2 (H) 04/29/2024 1334   LDLCALC 65 04/29/2024 1334   LDLCALC 48 10/27/2013 1127   LABVLDL 18 04/29/2024 1334     Lab Results  Component Value Date   TSH <0.005 (L) 05/20/2024   TSH <0.005 (L) 02/12/2024   TSH <0.005 (L) 10/16/2023   FREET4 2.59 (H) 05/20/2024   FREET4 1.57 02/12/2024        Assessment & Plan:   1. H/O partial thyroidectomy (Primary) 2. Abnormal TSH 3. Thyroid  nodule  she is being seen at a kind request of Gladis Mustard, FNP.  She is s/p RAI ablation on  04/01/24.  Her repeat thyroid  function tests still show over-activity, not yet responding to treatment.  She notes her symptoms are still present as well.  We did discuss possible treatment options moving forward like taking Prednisone  or Methimazole to help force the thyroid  to slow down hormone production, or to use Propanolol to help decrease her symptoms and wait a bit longer to see if her thyroid  responds.    Ultimately, she decided to try the Propanolol 20 mg po twice daily for now.  Will repeat labs in 8 weeks and follow up in office.  We went over symptoms to watch for that would mean her thyroid  is shutting down and she is advised to reach out if she experiences them so we can recheck labs sooner if needed.    -Patient is advised to maintain close follow up with Gladis Mustard, FNP for primary care needs.   I spent  26  minutes in the care of the patient today including review of labs from Thyroid  Function, CMP, and other relevant labs ; imaging/biopsy records (current and previous including abstractions from other facilities); face-to-face time discussing  her lab results and symptoms, medications doses, her options of short and long term treatment based on the latest standards of care / guidelines;   and documenting the encounter.  Lindsay Dickerson  participated in the discussions, expressed understanding, and voiced agreement with the above plans.  All questions were answered to her satisfaction. she is encouraged to contact clinic should she have any questions or concerns prior to her return visit.  Follow up plan: Return in about 8 weeks (around 07/22/2024) for Thyroid  follow up, Previsit labs.   Thank you for involving me in the care of this pleasant patient, and I will continue to update you with her progress.  Benton Rio, [facility] [address] [place], [place] [address] Phone: [phone] Fax:  [phone]  05/27/2024, 9:35 AM

## 2024-06-04 DIAGNOSIS — Z419 Encounter for procedure for purposes other than remedying health state, unspecified: Secondary | ICD-10-CM | POA: Diagnosis not present

## 2024-06-16 ENCOUNTER — Other Ambulatory Visit: Payer: Self-pay | Admitting: Nurse Practitioner

## 2024-06-16 DIAGNOSIS — I10 Essential (primary) hypertension: Secondary | ICD-10-CM

## 2024-06-16 DIAGNOSIS — F319 Bipolar disorder, unspecified: Secondary | ICD-10-CM

## 2024-06-16 DIAGNOSIS — F332 Major depressive disorder, recurrent severe without psychotic features: Secondary | ICD-10-CM

## 2024-06-16 DIAGNOSIS — E785 Hyperlipidemia, unspecified: Secondary | ICD-10-CM

## 2024-07-04 DIAGNOSIS — Z419 Encounter for procedure for purposes other than remedying health state, unspecified: Secondary | ICD-10-CM | POA: Diagnosis not present

## 2024-07-13 ENCOUNTER — Telehealth: Payer: Self-pay | Admitting: *Deleted

## 2024-07-13 NOTE — Telephone Encounter (Signed)
 Patient called and left a message that she had appointment in 07/22/24. She also ask if she was to have lab work and when should she go so that the results would be here. I have call the patient twice and was unable to leave a voicemail. She is to have lab work , orders are at [facility].

## 2024-07-19 ENCOUNTER — Encounter: Payer: Self-pay | Admitting: Physician Assistant

## 2024-07-19 ENCOUNTER — Ambulatory Visit: Admitting: Physician Assistant

## 2024-07-19 VITALS — BP 168/89 | HR 62

## 2024-07-19 DIAGNOSIS — L578 Other skin changes due to chronic exposure to nonionizing radiation: Secondary | ICD-10-CM

## 2024-07-19 DIAGNOSIS — L821 Other seborrheic keratosis: Secondary | ICD-10-CM | POA: Diagnosis not present

## 2024-07-19 DIAGNOSIS — W908XXA Exposure to other nonionizing radiation, initial encounter: Secondary | ICD-10-CM

## 2024-07-19 DIAGNOSIS — D1801 Hemangioma of skin and subcutaneous tissue: Secondary | ICD-10-CM | POA: Diagnosis not present

## 2024-07-19 DIAGNOSIS — Z1283 Encounter for screening for malignant neoplasm of skin: Secondary | ICD-10-CM | POA: Diagnosis not present

## 2024-07-19 DIAGNOSIS — L814 Other melanin hyperpigmentation: Secondary | ICD-10-CM | POA: Diagnosis not present

## 2024-07-19 DIAGNOSIS — D229 Melanocytic nevi, unspecified: Secondary | ICD-10-CM

## 2024-07-19 NOTE — Patient Instructions (Signed)

## 2024-07-19 NOTE — Progress Notes (Signed)
   New Patient Visit   Subjective  Lindsay Dickerson is a 53 y.o. female NEW PATIENT who presents for the following:  Total Body Skin Exam (TBSE)  Patient present today for new patient visit for TBSE.The patient denies she has spots, moles and lesions to be evaluated, some may be new or changing and the patient may have concern these could be cancer. Patient has not previously been treated by a dermatologist.Patient reports she does not have hx of bx. Patient admits to family history of skin cancers, patient reports that both her mom and father but unsure of what kind. Patient reports throughout her lifetime has had severe sun exposure. Currently, patient reports if she has excessive sun exposure, she does apply sunscreen and/or wears protective coverings.  The following portions of the chart were reviewed this encounter and updated as appropriate: medications, allergies, medical history  Review of Systems:  No other skin or systemic complaints except as noted in HPI or Assessment and Plan.  Objective  Well appearing patient in no apparent distress; mood and affect are within normal limits.  A full examination was performed including scalp, head, eyes, ears, nose, lips, neck, chest, axillae, abdomen, back, buttocks, bilateral upper extremities, bilateral lower extremities, hands, feet, fingers, toes, fingernails, and toenails. All findings within normal limits unless otherwise noted below.     Relevant exam findings are noted in the Assessment and Plan.    Assessment & Plan   LENTIGINES, SEBORRHEIC KERATOSES, HEMANGIOMAS - Benign normal skin lesions - Benign-appearing - Call for any changes  MELANOCYTIC NEVI - Tan-brown and/or pink-flesh-colored symmetric macules and papules - Benign appearing on exam today - Observation - Call clinic for new or changing moles - Recommend daily use of broad spectrum spf 30+ sunscreen to sun-exposed areas.   ACTINIC DAMAGE - Chronic condition,  secondary to cumulative UV/sun exposure - diffuse scaly erythematous macules with underlying dyspigmentation - Recommend daily broad spectrum sunscreen SPF 30+ to sun-exposed areas, reapply every 2 hours as needed.  - Staying in the shade or wearing long sleeves, sun glasses (UVA+UVB protection) and wide brim hats (4-inch brim around the entire circumference of the hat) are also recommended for sun protection.  - Call for new or changing lesions.  SKIN CANCER SCREENING PERFORMED TODAY LENTIGINES   SEBORRHEIC KERATOSIS   CHERRY ANGIOMA   ACTINIC SKIN DAMAGE   SCREENING EXAM FOR SKIN CANCER   MULTIPLE BENIGN NEVI    Return in about 2 years (around 07/19/2026) for TBSE follow up.  I, Doyce Pan, CMA, am acting as scribe for Jamesyn Lindell K, PA-C.   Documentation: I have reviewed the above documentation for accuracy and completeness, and I agree with the above.  Nasean Zapf K, PA-C

## 2024-07-22 ENCOUNTER — Ambulatory Visit: Admitting: Nurse Practitioner

## 2024-08-03 ENCOUNTER — Ambulatory Visit: Payer: Self-pay | Admitting: Nurse Practitioner

## 2024-08-03 DIAGNOSIS — E890 Postprocedural hypothyroidism: Secondary | ICD-10-CM

## 2024-08-03 LAB — TSH: TSH: 87.1 u[IU]/mL — ABNORMAL HIGH (ref 0.450–4.500)

## 2024-08-03 LAB — T4, FREE: Free T4: <0.1 ng/dL — AB (ref 0.82–1.77)

## 2024-08-03 LAB — T3, FREE: T3, Free: <0.3 pg/mL — ABNORMAL LOW (ref 2.0–4.4)

## 2024-08-03 MED ORDER — LEVOTHYROXINE SODIUM 75 MCG PO TABS: 75.0000 ug | ORAL_TABLET | Freq: Every day | ORAL | 1 refills | Status: AC

## 2024-08-03 NOTE — Progress Notes (Signed)
 Patient will need repeat labs prior to her appt in Jan.

## 2024-08-04 DIAGNOSIS — Z419 Encounter for procedure for purposes other than remedying health state, unspecified: Secondary | ICD-10-CM | POA: Diagnosis not present

## 2024-09-03 DIAGNOSIS — Z419 Encounter for procedure for purposes other than remedying health state, unspecified: Secondary | ICD-10-CM | POA: Diagnosis not present

## 2024-09-22 ENCOUNTER — Ambulatory Visit: Admitting: Nurse Practitioner

## 2024-09-28 DIAGNOSIS — F319 Bipolar disorder, unspecified: Secondary | ICD-10-CM

## 2024-10-04 ENCOUNTER — Ambulatory Visit: Payer: Self-pay

## 2024-10-04 ENCOUNTER — Telehealth: Payer: Self-pay | Admitting: Nurse Practitioner

## 2024-10-04 NOTE — Telephone Encounter (Signed)
 Appointment has been scheduled

## 2024-10-04 NOTE — Telephone Encounter (Signed)
Please see Triage Encounter.

## 2024-10-04 NOTE — Telephone Encounter (Signed)
 Phone call encounter stated patient needs to be seen.  Appointment has been scheduled

## 2024-10-04 NOTE — Telephone Encounter (Signed)
 Please review

## 2024-10-04 NOTE — Telephone Encounter (Signed)
 NTBS since she has  not had in awhile

## 2024-10-04 NOTE — Telephone Encounter (Signed)
 Copied from CRM #[id number]. Topic: Clinical - Medication Refill >> Oct 04, 2024 10:22 AM Graeme ORN wrote: Medication: metroNIDAZOLE  (METROGEL ) 0.75 % vaginal gel   Has the patient contacted their pharmacy? Yes (Agent: If no, request that the patient contact the pharmacy for the refill. If patient does not wish to contact the pharmacy document the reason why and proceed with request.) (Agent: If yes, when and what did the pharmacy advise?) call provider  This is the patient's preferred pharmacy:  [facility] [place], [place] - [address] [place] [address] Phone: [phone] Fax: [phone]  Is this the correct pharmacy for this prescription? Yes If no, delete pharmacy and type the correct one.   Has the prescription been filled recently? No  Is the patient out of the medication? Yes  Has the patient been seen for an appointment in the last year OR does the patient have an upcoming appointment? Yes  Can we respond through MyChart? No  Agent: Please be advised that Rx refills may take up to 3 business days. We ask that you follow-up with your pharmacy.   ----------------------------------------------------------------------- From previous Reason for Contact - Scheduling: Patient/patient representative is calling to schedule an appointment. Refer to attachments for appointment information.

## 2024-10-04 NOTE — Telephone Encounter (Signed)
 FYI Only or Action Required?: Action required by provider: update on patient condition and appt 1/15.-- has refill request in for hx of BV- symptoms consistent with- please contact if ok to cancel appt and refill meds  Patient was last seen in primary care on 04/23/2024 by Gladis Mustard, FNP.  Called Nurse Triage reporting Vaginal Discharge.  Symptoms began several days ago.  Interventions attempted: Rest, hydration, or home remedies.  Symptoms are: gradually worsening.  Triage Disposition: See PCP When Office is Open (Within 3 Days)  Patient/caregiver understands and will follow disposition?: Yes  Copied from CRM #[id number]. Topic: Clinical - Medication Refill >> Oct 04, 2024 10:22 AM Graeme ORN wrote: Medication: metroNIDAZOLE  (METROGEL ) 0.75 % vaginal gel      Reason for Disposition  Bad smelling vaginal discharge  Answer Assessment - Initial Assessment Questions Patient has BV hx and has seen specialist before. Symptoms consistent with bacterial infection. Bad odor and beige discharge. Denies itching, pain, fever, swelling, or abdominal pain.   Appt made by PAS for 1/15 to assess/PAP. Patient would like medication sent in. If can cancel appt does have to work that afternoon. Pharmacy confirmed.    1. DISCHARGE: Describe the discharge. (e.g., white, yellow, green, gray, foamy, cottage cheese-like)     Beige/ cream thick 2. ODOR: Is there a bad odor?     yes 3. ONSET: When did the discharge begin?     Couple days 4. RASH: Is there a rash in the genital area? If Yes, ask: Describe it. (e.g., redness, blisters, sores, bumps)     denies 5. ABDOMEN PAIN: Are you having any abdomen pain? If Yes, ask: What does it feel like?  (e.g., crampy, dull, intermittent, constant)      denies 6. ABDOMEN PAIN SEVERITY: If present, ask: How bad is it? (e.g., Scale 1-10; mild, moderate, or severe)     na 7. CAUSE: What do you think is causing the discharge? Have you  had the same problem before? What happened then?     BV history 8. OTHER SYMPTOMS: Do you have any other symptoms? (e.g., fever, itching, urination pain, vaginal bleeding, vaginal foreign body)     Denies  Protocols used: Vaginal Discharge-A-AH

## 2024-10-07 ENCOUNTER — Encounter: Payer: Self-pay | Admitting: Nurse Practitioner

## 2024-10-07 ENCOUNTER — Ambulatory Visit: Admitting: Nurse Practitioner

## 2024-10-07 VITALS — BP 102/69 | HR 67 | Temp 97.0°F | Ht 66.0 in | Wt 124.0 lb

## 2024-10-07 DIAGNOSIS — N760 Acute vaginitis: Secondary | ICD-10-CM | POA: Diagnosis not present

## 2024-10-07 DIAGNOSIS — N898 Other specified noninflammatory disorders of vagina: Secondary | ICD-10-CM

## 2024-10-07 DIAGNOSIS — B9689 Other specified bacterial agents as the cause of diseases classified elsewhere: Secondary | ICD-10-CM

## 2024-10-07 DIAGNOSIS — I10 Essential (primary) hypertension: Secondary | ICD-10-CM | POA: Diagnosis not present

## 2024-10-07 DIAGNOSIS — R7989 Other specified abnormal findings of blood chemistry: Secondary | ICD-10-CM

## 2024-10-07 LAB — WET PREP FOR TRICH, YEAST, CLUE
Clue Cell Exam: POSITIVE — AB
Trichomonas Exam: NEGATIVE
Yeast Exam: NEGATIVE

## 2024-10-07 MED ORDER — METRONIDAZOLE 500 MG PO TABS: 500.0000 mg | ORAL_TABLET | Freq: Two times a day (BID) | ORAL | 0 refills | Status: AC

## 2024-10-07 MED ORDER — METRONIDAZOLE 500 MG PO TABS: 500.0000 mg | ORAL_TABLET | Freq: Three times a day (TID) | ORAL | 0 refills | Status: DC

## 2024-10-07 NOTE — Addendum Note (Signed)
 Addended by: Jalacia Mattila, MARY-MARGARET on: 10/07/2024 10:00 AM   Modules accepted: Orders

## 2024-10-07 NOTE — Progress Notes (Signed)
" ° °  Subjective:    Patient ID: Lindsay Dickerson, female    DOB: 04-09-1971, 54 y.o.   MRN: [id number]   Chief Complaint: Vaginal Discharge   Vaginal Discharge The patient's primary symptoms include vaginal discharge. Pertinent negatives include no abdominal pain, headaches or rash.    Patient comes in today c/o vaginal discharge. She has a past history of frequent bacterial vaginosis. It has been a few years since had flare up. Started a couple weeks ago with fishy odor. Slight yellow discharge. Patient Active Problem List   Diagnosis Date Noted   Bipolar 1 disorder (HCC) 07/25/2017   Bacterial vaginosis 07/25/2017   Psoriasis 05/14/2016   Depression 10/05/2014   Hypertension 10/27/2013   Hyperlipidemia with target LDL less than 100 10/27/2013       Review of Systems  Constitutional:  Negative for diaphoresis.  Eyes:  Negative for pain.  Respiratory:  Negative for shortness of breath.   Cardiovascular:  Negative for chest pain, palpitations and leg swelling.  Gastrointestinal:  Negative for abdominal pain.  Endocrine: Negative for polydipsia.  Genitourinary:  Positive for vaginal discharge.  Skin:  Negative for rash.  Neurological:  Negative for dizziness, weakness and headaches.  Hematological:  Does not bruise/bleed easily.  All other systems reviewed and are negative.      Objective:   Physical Exam Constitutional:      Appearance: Normal appearance.  Cardiovascular:     Rate and Rhythm: Normal rate and regular rhythm.     Heart sounds: Normal heart sounds.  Pulmonary:     Effort: Pulmonary effort is normal.     Breath sounds: Normal breath sounds.  Genitourinary:    Comments: No pelvic exam performed Skin:    General: Skin is warm.  Neurological:     General: No focal deficit present.     Mental Status: She is alert and oriented to person, place, and time.  Psychiatric:        Mood and Affect: Mood normal.        Behavior: Behavior normal.    BP  102/69   Pulse 67   Temp (!) 97 F (36.1 C) (Temporal)   Ht 5' 6 (1.676 m)   Wt 124 lb (56.2 kg)   SpO2 98%   BMI 20.01 kg/m   Wet prep- clue cells      Assessment & Plan:  Lindsay Dickerson in today with chief complaint of Vaginal Discharge   1. Vaginal discharge (Primary) - WET PREP FOR TRICH, YEAST, CLUE  2. Bacterial vaginosis Avoid bubble baths - metroNIDAZOLE  (FLAGYL ) 500 MG tablet; Take 1 tablet (500 mg total) by mouth 3 (three) times daily for 7 days.  Dispense: 14 tablet; Refill: 0  3. Low TSH level Labs for another provider - Thyroid  Panel With TSH  4. Primary hypertension Labs for another provider - CBC with Differential/Platelet - CMP14+EGFR    The above assessment and management plan was discussed with the patient. The patient verbalized understanding of and has agreed to the management plan. Patient is aware to Dickerson the clinic if symptoms persist or worsen. Patient is aware when to return to the clinic for a follow-up visit. Patient educated on when it is appropriate to go to the emergency department.   Mary-Margaret Gladis, FNP    "

## 2024-10-07 NOTE — Patient Instructions (Signed)
 Vaginal Infection (Bacterial Vaginosis): What to Know  Bacterial vaginosis is an infection of the vagina. It happens when the balance of normal germs (bacteria) in the vagina changes. If you don't get treated, it can make it easier for you to get other infections from sex. These are called sexually transmitted infections (STIs). If you're pregnant, you need to get treated right away. This infection can cause a baby to be born early or at a low birth weight. What are the causes? This infection happens when too many harmful germs grow in the vagina. You can't get this infection from toilet seats, bedsheets, swimming pools, or things that touch your vagina. What increases the risk? Having sex with a new person or more than one person. Having sex without protection. Douching. Having an intrauterine device (IUD). Smoking. Using drugs or drinking alcohol. These can lead you to do risky things. Taking certain antibiotics. Being pregnant. What are the signs or symptoms? Some females have no symptoms. Symptoms may include: A gray or white discharge from your vagina. It can be watery or foamy. A fishy smell. This can happen after sex or during your menstrual period. Itching in and around your vagina. Burning or pain when you pee. How is this treated? This infection is treated with antibiotics. These may be given to you as: A pill. A cream for your vagina. A medicine that you put into your vagina (suppository). If the infection comes back, you may need more antibiotics. Follow these instructions at home: Medicines Take your medicines as told. Take or use your antibiotics as told. Do not stop using them even if you start to feel better. General instructions If the person you have sex with is a female, tell her that you have this infection. She will need to follow up with her doctor. Female partners don't need to be treated. Do not have sex until you finish treatment. Drink more fluids as  told. Keep your vagina and butt clean. Wash these areas with warm water each day. Wipe from front to back after you poop. If you're breastfeeding a baby, talk to your doctor if you should keep doing so during treatment. How is this prevented? Self-care Do not douche. Do not use deodorant sprays on your vagina. Wear cotton underwear. Do not wear tight pants and pantyhose, especially in the summer. Safe sex Use condoms the correct way and every time you have sex. Use dental dams to protect yourself during oral sex. Limit how many people you have sex with. Get tested for STIs. The person you have sex with should also get tested. Drugs and alcohol Do not smoke, vape, or use nicotine or tobacco. Do not use drugs. Limit the amount of alcohol you drink because it can lead you to do risky things. Where to find more information To learn more: Go to [employer]erProm[employer].no. Click Health Topics A-Z. Type bacterial vaginosis in the search bar. American Sexual Health Association (ASHA): ashasexualhealth.org [place] Department of Health and [employer], Office on Women's Health: TravelLesson.ca Contact a doctor if: Your symptoms don't get better, even after treatment. You have more discharge or pain when you pee. You have a fever or chills. You have pain in your belly or in the area between your hips. You have pain during sex. You bleed from your vagina between menstrual periods. This information is not intended to replace advice given to you by your health care provider. Make sure you discuss any questions you have with your health care provider. Document  Revised: 02/26/2023 Document Reviewed: 02/26/2023 Elsevier Patient Education  2024 [employer].

## 2024-10-08 ENCOUNTER — Ambulatory Visit: Payer: Self-pay | Admitting: Nurse Practitioner

## 2024-10-08 LAB — CBC WITH DIFFERENTIAL/PLATELET
Basophils Absolute: 0.1 x10E3/uL (ref 0.0–0.2)
Basos: 2 %
EOS (ABSOLUTE): 0.9 x10E3/uL — ABNORMAL HIGH (ref 0.0–0.4)
Eos: 14 %
Hematocrit: 34.4 % (ref 34.0–46.6)
Hemoglobin: 11.5 g/dL (ref 11.1–15.9)
Immature Grans (Abs): 0 x10E3/uL (ref 0.0–0.1)
Immature Granulocytes: 0 %
Lymphocytes Absolute: 2.3 x10E3/uL (ref 0.7–3.1)
Lymphs: 37 %
MCH: 32 pg (ref 26.6–33.0)
MCHC: 33.4 g/dL (ref 31.5–35.7)
MCV: 96 fL (ref 79–97)
Monocytes Absolute: 0.7 x10E3/uL (ref 0.1–0.9)
Monocytes: 12 %
Neutrophils Absolute: 2.2 x10E3/uL (ref 1.4–7.0)
Neutrophils: 35 %
Platelets: 302 x10E3/uL (ref 150–450)
RBC: 3.59 x10E6/uL — ABNORMAL LOW (ref 3.77–5.28)
RDW: 12.3 % (ref 11.7–15.4)
WBC: 6.2 x10E3/uL (ref 3.4–10.8)

## 2024-10-08 LAB — CMP14+EGFR
ALT: 10 IU/L (ref 0–32)
AST: 31 IU/L (ref 0–40)
Albumin: 4.2 g/dL (ref 3.8–4.9)
Alkaline Phosphatase: 36 IU/L — ABNORMAL LOW (ref 49–135)
BUN/Creatinine Ratio: 18 (ref 9–23)
BUN: 18 mg/dL (ref 6–24)
Bilirubin Total: 0.4 mg/dL (ref 0.0–1.2)
CO2: 22 mmol/L (ref 20–29)
Calcium: 9.4 mg/dL (ref 8.7–10.2)
Chloride: 99 mmol/L (ref 96–106)
Creatinine, Ser: 0.99 mg/dL (ref 0.57–1.00)
Globulin, Total: 2.9 g/dL (ref 1.5–4.5)
Glucose: 122 mg/dL — ABNORMAL HIGH (ref 70–99)
Potassium: 4.4 mmol/L (ref 3.5–5.2)
Sodium: 135 mmol/L (ref 134–144)
Total Protein: 7.1 g/dL (ref 6.0–8.5)
eGFR: 67 mL/min/1.73

## 2024-10-08 LAB — THYROID PANEL WITH TSH
Free Thyroxine Index: 2.2 (ref 1.2–4.9)
T3 Uptake Ratio: 30 % (ref 24–39)
T4, Total: 7.2 ug/dL (ref 4.5–12.0)
TSH: 5.63 u[IU]/mL — ABNORMAL HIGH (ref 0.450–4.500)

## 2024-10-14 ENCOUNTER — Ambulatory Visit: Admitting: Nurse Practitioner

## 2024-10-14 DIAGNOSIS — E890 Postprocedural hypothyroidism: Secondary | ICD-10-CM

## 2024-10-27 ENCOUNTER — Telehealth: Payer: Self-pay | Admitting: *Deleted

## 2024-10-27 NOTE — Telephone Encounter (Signed)
 Patient left a message that she has called our office and has never received a call back. She is wanting to make appointment for virtual online visit. Her call back please.

## 2024-10-27 NOTE — Telephone Encounter (Signed)
 Noted that appointments will attempt to reach the patient again.

## 2024-10-27 NOTE — Telephone Encounter (Signed)
"   Pt doesn't answer and she no showed on 1/22. I will attempt again "

## 2024-11-03 ENCOUNTER — Ambulatory Visit: Admitting: Nurse Practitioner

## 2024-11-04 ENCOUNTER — Ambulatory Visit: Admitting: Nurse Practitioner

## 2026-07-17 ENCOUNTER — Ambulatory Visit: Admitting: Physician Assistant

## 2026-07-19 ENCOUNTER — Ambulatory Visit: Admitting: Physician Assistant
